# Patient Record
Sex: Female | Born: 1960 | Race: Black or African American | Hispanic: No | Marital: Single | Smoking: Current every day smoker
Health system: Southern US, Community
[De-identification: ages and names within clinical notes are randomized; demographics above are authoritative.]

## PROBLEM LIST (undated history)

## (undated) DIAGNOSIS — R519 Headache, unspecified: Secondary | ICD-10-CM

## (undated) DIAGNOSIS — M1990 Unspecified osteoarthritis, unspecified site: Secondary | ICD-10-CM

## (undated) DIAGNOSIS — I10 Essential (primary) hypertension: Secondary | ICD-10-CM

---

## 2007-03-03 HISTORY — PX: BREAST CYST ASPIRATION: SHX578

## 2007-10-01 ENCOUNTER — Ambulatory Visit: Payer: Self-pay | Admitting: Oncology

## 2007-10-03 ENCOUNTER — Emergency Department: Payer: Self-pay | Admitting: Emergency Medicine

## 2007-10-04 ENCOUNTER — Ambulatory Visit: Payer: Self-pay | Admitting: Oncology

## 2007-10-05 ENCOUNTER — Ambulatory Visit: Payer: Self-pay

## 2007-10-11 ENCOUNTER — Emergency Department: Payer: Self-pay | Admitting: Emergency Medicine

## 2007-11-01 ENCOUNTER — Ambulatory Visit: Payer: Self-pay | Admitting: Oncology

## 2013-12-01 ENCOUNTER — Emergency Department: Payer: Self-pay | Admitting: Student

## 2013-12-01 LAB — CBC
HCT: 41.9 % (ref 35.0–47.0)
HGB: 13.2 g/dL (ref 12.0–16.0)
MCH: 29 pg (ref 26.0–34.0)
MCHC: 31.6 g/dL — ABNORMAL LOW (ref 32.0–36.0)
MCV: 92 fL (ref 80–100)
Platelet: 315 10*3/uL (ref 150–440)
RBC: 4.56 10*6/uL (ref 3.80–5.20)
RDW: 14 % (ref 11.5–14.5)
WBC: 5.7 10*3/uL (ref 3.6–11.0)

## 2013-12-01 LAB — BASIC METABOLIC PANEL
Anion Gap: 6 — ABNORMAL LOW (ref 7–16)
BUN: 12 mg/dL (ref 7–18)
CALCIUM: 8.6 mg/dL (ref 8.5–10.1)
CHLORIDE: 108 mmol/L — AB (ref 98–107)
CO2: 26 mmol/L (ref 21–32)
CREATININE: 0.94 mg/dL (ref 0.60–1.30)
EGFR (Non-African Amer.): >60
Glucose: 87 mg/dL (ref 65–99)
Osmolality: 279 (ref 275–301)
Potassium: 3.5 mmol/L (ref 3.5–5.1)
Sodium: 140 mmol/L (ref 136–145)

## 2013-12-01 LAB — TROPONIN I

## 2016-02-17 ENCOUNTER — Encounter: Payer: Self-pay | Admitting: Emergency Medicine

## 2016-02-17 ENCOUNTER — Emergency Department
Admission: EM | Admit: 2016-02-17 | Discharge: 2016-02-17 | Disposition: A | Discharge status: Home / Self Care | Payer: Self-pay | Attending: Emergency Medicine | Admitting: Emergency Medicine

## 2016-02-17 ENCOUNTER — Emergency Department: Payer: Self-pay

## 2016-02-17 DIAGNOSIS — M25511 Pain in right shoulder: Secondary | ICD-10-CM | POA: Insufficient documentation

## 2016-02-17 MED ORDER — PREDNISONE 10 MG PO TABS: 40.0000 mg | ORAL_TABLET | Freq: Every day | ORAL | 0 refills | Status: AC

## 2016-02-17 MED ORDER — MELOXICAM 15 MG PO TABS: 15.0000 mg | ORAL_TABLET | Freq: Every day | ORAL | 0 refills | Status: AC

## 2016-02-17 NOTE — ED Triage Notes (Signed)
States she fell about 3 weeks ago  Landed on right shoulder  cpnts to have pain   No deformity noted

## 2016-02-17 NOTE — ED Provider Notes (Signed)
[facility] Emergency Department Provider Note  ____________________________________________  Time seen: Approximately 1:09 PM  I have reviewed the triage vital signs and the nursing notes.   HISTORY  Chief Complaint Shoulder Pain    HPI Teresa Combs is a 55 y.o. female presents to the emergency department with right shoulder pain for 3 weeks. Patient states that she was helping a friend get up and irritated something in her shoulder. Patient states that it is difficult to move arm above head. Patient has a history of tendinitis in that shoulder. Patient states that she has taken everything over-the-counter for pain.No sensation changes in fingertips.   History reviewed. No pertinent past medical history.  There are no active problems to display for this patient.   History reviewed. No pertinent surgical history.  Prior to Admission medications   Medication Sig Start Date End Date Taking? Authorizing Provider  meloxicam (MOBIC) 15 MG tablet Take 1 tablet (15 mg total) by mouth daily. 02/17/16 02/27/16  Laban Emperor, PA-C  predniSONE (DELTASONE) 10 MG tablet Take 4 tablets (40 mg total) by mouth daily. 02/17/16 02/22/16  Laban Emperor, PA-C    Allergies Patient has no known allergies.  No family history on file.  Social History Social History  Substance Use Topics  . Smoking status: Never Smoker  . Smokeless tobacco: Never Used  . Alcohol use No     Review of Systems  Constitutional: No fever/chills ENT: No upper respiratory complaints. Cardiovascular: No chest pain. Respiratory: No cough. No SOB. Gastrointestinal: No abdominal pain.  No nausea, no vomiting.  Skin: Negative for rash, abrasions, lacerations, ecchymosis. Neurological: Negative for headaches, numbness or tingling   ____________________________________________   PHYSICAL EXAM:  VITAL SIGNS: ED Triage Vitals [02/17/16 1125]  Enc Vitals Group     BP (!) 132/91     Pulse Rate 99     Resp 16     Temp 98.1 F (36.7 C)     Temp Source Oral     SpO2 99 %     Weight 128 lb (58.1 kg)     Height 5\' 5"  (1.651 m)     Head Circumference      Peak Flow      Pain Score 9     Pain Loc      Pain Edu?      Excl. in [place]?      Constitutional: Alert and oriented. Well appearing and in no acute distress. Eyes: Conjunctivae are normal. PERRL. EOMI. Head: Atraumatic. ENT:      Ears:      Nose: No congestion/rhinnorhea.      Mouth/Throat: Mucous membranes are moist.  Neck: No stridor.  No cervical spine tenderness to palpation. Hematological/Lymphatic/Immunilogical: No cervical lymphadenopathy. Cardiovascular: Normal rate, regular rhythm. Normal S1 and S2.  Good peripheral circulation. 2+ radial pulses.  Respiratory: Normal respiratory effort without tachypnea or retractions. Lungs CTAB. Good air entry to the bases with no decreased or absent breath sounds. Musculoskeletal: No gross deformities appreciated. Limited shoulder exam secondary to to pain. Patient unable to flex, extend, or abduct shoulder. Tenderness to palpation over biceps head.  Neurologic:  Normal speech and language. No gross focal neurologic deficits are appreciated. Sensation of fingers intact.  Skin:  Skin is warm, dry and intact. No rash noted. Psychiatric: Mood and affect are normal. Speech and behavior are normal. Patient exhibits appropriate insight and judgement.   ____________________________________________   LABS (all labs ordered are listed, but only abnormal results  are displayed)  Labs Reviewed - No data to display ____________________________________________  EKG   ____________________________________________  RADIOLOGY Robinette Haines, personally viewed and evaluated these images (plain radiographs) as part of my medical decision making, as well as reviewing the written report by the radiologist.  Dg Shoulder Right  Result Date: 02/17/2016 CLINICAL DATA:  The  patient fell from a standing position striking the right shoulder 3 weeks ago. Persistent anterior shoulder pain. EXAM: RIGHT SHOULDER - 2+ VIEW COMPARISON:  None in PACs FINDINGS: The bones are subjectively adequately mineralized. Small osteophytes arise from the articular margins of the humeral head. The glenohumeral joint space is preserved. There is mild narrowing of the AC joint. The subacromial subdeltoid space is preserved. The observed portions of the right clavicle and upper right ribs are normal. IMPRESSION: Mild degenerative change of the right shoulder. No acute or healing fracture is observed. If the patient's symptoms persist, MRI would be a useful next imaging step. Electronically Signed   By: David  Martinique M.D.   On: 02/17/2016 13:05    ____________________________________________    PROCEDURES  Procedure(s) performed:    Procedures    Medications - No data to display   ____________________________________________   INITIAL IMPRESSION / ASSESSMENT AND PLAN / ED COURSE  Pertinent labs & imaging results that were available during my care of the patient were reviewed by me and considered in my medical decision making (see chart for details).  Review of the [place] CSRS was performed in accordance of the [place] prior to dispensing any controlled drugs.  Clinical Course     Patient likely has a flare up of tendinitis. Patient could also have a rotator cuff tear. Patient will be discharged home with prescriptions for prednisone and meloxicam. Patient is to follow up with ortho as directed. Patient is given ED precautions to return to the ED for any worsening or new symptoms.  ____________________________________________  FINAL CLINICAL IMPRESSION(S) / ED DIAGNOSES  Final diagnoses:  Acute pain of right shoulder      NEW MEDICATIONS STARTED DURING THIS VISIT:  Discharge Medication List as of 02/17/2016  1:11 PM    START taking these medications   Details  meloxicam  (MOBIC) 15 MG tablet Take 1 tablet (15 mg total) by mouth daily., Starting Mon 02/17/2016, Until Thu 02/27/2016, Print    predniSONE (DELTASONE) 10 MG tablet Take 4 tablets (40 mg total) by mouth daily., Starting Mon 02/17/2016, Until Sat 02/22/2016, Print            This chart was dictated using voice recognition software/Dragon. Despite best efforts to proofread, errors can occur which can change the meaning. Any change was purely unintentional.    Laban Emperor, PA-C 02/17/16 1330    Harvest Dark, MD 02/17/16 [id number]

## 2016-08-12 ENCOUNTER — Ambulatory Visit: Payer: Self-pay

## 2016-09-09 ENCOUNTER — Ambulatory Visit: Payer: Self-pay | Attending: Oncology | Admitting: *Deleted

## 2016-09-09 ENCOUNTER — Encounter: Payer: Self-pay | Admitting: *Deleted

## 2016-09-09 ENCOUNTER — Encounter (INDEPENDENT_AMBULATORY_CARE_PROVIDER_SITE_OTHER): Payer: Self-pay

## 2016-09-09 ENCOUNTER — Other Ambulatory Visit: Payer: Self-pay | Admitting: *Deleted

## 2016-09-09 ENCOUNTER — Telehealth: Payer: Self-pay | Admitting: *Deleted

## 2016-09-09 VITALS — BP 131/91 | HR 65 | Temp 95.0°F | Resp 18 | Ht 67.0 in | Wt 124.0 lb

## 2016-09-09 DIAGNOSIS — N852 Hypertrophy of uterus: Secondary | ICD-10-CM

## 2016-09-09 DIAGNOSIS — N63 Unspecified lump in unspecified breast: Secondary | ICD-10-CM

## 2016-09-09 DIAGNOSIS — Z0000 Encounter for general adult medical examination without abnormal findings: Secondary | ICD-10-CM

## 2016-09-09 NOTE — Patient Instructions (Signed)
Gave patient hand-out, Women Staying Healthy, Active and Well from [facility], with education on breast health, pap smears, heart and colon health. 

## 2016-09-09 NOTE — Progress Notes (Addendum)
Subjective:     Patient ID: Teresa Combs, female   DOB: [birth year], 56 y.o.   MRN: [id number]  HPI   Review of Systems     Objective:   Physical Exam  Neck:    Pulmonary/Chest: Right breast exhibits mass, nipple discharge and tenderness. Right breast exhibits no inverted nipple and no skin change. Left breast exhibits mass and nipple discharge. Left breast exhibits no inverted nipple, no skin change and no tenderness. Breasts are symmetrical.    Bilateral breast with white nipple discharge from multiple ducts  Abdominal: There is no splenomegaly or hepatomegaly.  Genitourinary: There is no rash, tenderness, lesion or injury on the right labia. There is no rash, tenderness, lesion or injury on the left labia. Uterus is not enlarged. Cervix exhibits no motion tenderness, no discharge and no friability. Right adnexum displays no mass, no tenderness and no fullness. Left adnexum displays no mass, no tenderness and no fullness. No erythema, tenderness or bleeding in the vagina. No foreign body in the vagina. No signs of injury around the vagina. No vaginal discharge found.    Genitourinary Comments: Very full heavy uterus that is palpable vaginally and rectally.       Assessment:     56 year old Black female presents to [facility] with complaints of a right breast mass.  States the mass has been present for approximately 1 month.  History of bilateral breast masses in 2009.  Patient had a birads 4 mammogram and states she had a benign biopsy.  States she was seen by Teresa Combs at the time.  There are no pathology records for review at this time.  On clinical breast exam I can palpate a hard, mobile 3X2 cm tender mass at 8:30 to 9:00 right breast, and an approximate 1 cm firm mass at 1:00 left breast 3 cm from the areola.  There is also bilateral white nipple discharge from multiple ducts.  The discharge is on expression only.  Patient states "I have always had this discharge".  Taught self  breast exam.  There is a soft mobile, approximately 3 cm mass at the posterior cervical spine.  Patient states it has been present for about 1 year.  The patient has had no medical exams since 2009.  Specimen collected for pap smear. 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  Spoke with Teresa Combs, our OB/GYN Oncologist in regards to her enlarged uterus or possible mass.  He agreed to exam her.  Patient was examined by Teresa Combs.  He stated he wasn't really impressed with the findings.  He stated slightly enlarged retroverted uterus.  He does want to get a pelvic ultrasound since she currently has regular menstrual cycles at age 56, and periods only lasting 3-4 days. Patient has been screened for eligibility.  She does not have any insurance, Medicare or Medicaid.  She also meets financial eligibility.  Hand-out given on the Affordable Care Act.    Plan:     Will get bilateral diagnostic mammogram and ultrasound.  Order also placed for pelvic ultrasound. Will follow-up per [facility] protocol.

## 2016-09-10 NOTE — Telephone Encounter (Signed)
Patient notified of her appointment on 09/11/16 @ 4:30 for her pelvic ultrasound.

## 2016-09-11 ENCOUNTER — Ambulatory Visit
Admission: RE | Admit: 2016-09-11 | Discharge: 2016-09-11 | Disposition: A | Discharge status: Home / Self Care | Payer: Self-pay | Source: Ambulatory Visit | Attending: Obstetrics and Gynecology | Admitting: Obstetrics and Gynecology

## 2016-09-11 DIAGNOSIS — N852 Hypertrophy of uterus: Secondary | ICD-10-CM

## 2016-09-11 DIAGNOSIS — D259 Leiomyoma of uterus, unspecified: Secondary | ICD-10-CM | POA: Insufficient documentation

## 2016-09-11 LAB — PAP LB AND HPV HIGH-RISK
HPV, HIGH-RISK: POSITIVE — AB
PAP Smear Comment: 0

## 2016-09-14 ENCOUNTER — Ambulatory Visit
Admission: RE | Admit: 2016-09-14 | Discharge: 2016-09-14 | Disposition: A | Discharge status: Home / Self Care | Payer: Self-pay | Source: Ambulatory Visit | Attending: Oncology | Admitting: Oncology

## 2016-09-14 ENCOUNTER — Ambulatory Visit: Payer: Self-pay

## 2016-09-14 DIAGNOSIS — N63 Unspecified lump in unspecified breast: Secondary | ICD-10-CM

## 2016-09-15 ENCOUNTER — Other Ambulatory Visit: Payer: Self-pay | Admitting: Oncology

## 2016-09-15 ENCOUNTER — Other Ambulatory Visit: Payer: Self-pay

## 2016-09-15 DIAGNOSIS — N6001 Solitary cyst of right breast: Secondary | ICD-10-CM

## 2016-09-15 DIAGNOSIS — N63 Unspecified lump in unspecified breast: Secondary | ICD-10-CM

## 2016-09-24 ENCOUNTER — Ambulatory Visit
Admission: RE | Admit: 2016-09-24 | Discharge: 2016-09-24 | Disposition: A | Discharge status: Home / Self Care | Payer: Self-pay | Source: Ambulatory Visit | Attending: Oncology | Admitting: Oncology

## 2016-09-24 ENCOUNTER — Encounter: Payer: Self-pay | Admitting: *Deleted

## 2016-09-24 ENCOUNTER — Other Ambulatory Visit: Payer: Self-pay | Admitting: *Deleted

## 2016-09-24 DIAGNOSIS — N63 Unspecified lump in unspecified breast: Secondary | ICD-10-CM

## 2016-09-24 DIAGNOSIS — N6001 Solitary cyst of right breast: Secondary | ICD-10-CM

## 2016-09-24 NOTE — Progress Notes (Signed)
Received results of patients cyst aspiration today and need for 6 month follow-up of bilateral breast masses.  Biopsy of the left breast was cancelled today, and a 6 month follow up ultrasound was recommended.  Spoke to patient and she is agreeable to the plan.  I have also given her the results of her pelvic ultrasound demonstrating multiple fibroids.  Appointment has been scheduled for bilateral ultrasounds on March 29, 2017 @ 9:30.  Letter mailed to patient with appointment information.  Will continue to follow-up per [facility] protocol.

## 2017-03-24 ENCOUNTER — Ambulatory Visit: Payer: Self-pay | Attending: Oncology

## 2017-03-29 ENCOUNTER — Other Ambulatory Visit: Payer: Self-pay

## 2017-04-05 ENCOUNTER — Telehealth: Payer: Self-pay | Admitting: *Deleted

## 2017-04-05 NOTE — Telephone Encounter (Signed)
Patient missed her 6 month follow up mammogram.  Left her a message to return my call.

## 2017-04-23 ENCOUNTER — Telehealth: Payer: Self-pay | Admitting: *Deleted

## 2017-04-23 NOTE — Telephone Encounter (Addendum)
Patient missed her 6 month follow-up mammogram.  This the second message I have left her.  The breast center has also mailed her 2 letters.  If no response I will close case as refusal to follow-up.

## 2018-03-29 IMAGING — MG US ASPIRATION RIGHT BREAST
1 series · 2 of 2 positions shown · non-contrast
Comparison: Previous exams.

CLINICAL DATA: 56-year-old female presenting for ultrasound-guided
aspiration of a right breast cyst for symptomatic relief.

EXAM:
ULTRASOUND GUIDED RIGHT BREAST CYST ASPIRATION

[Series 1: MG view · 0.07mm/px · 2 of 2 slices shown]
[im 1/2]
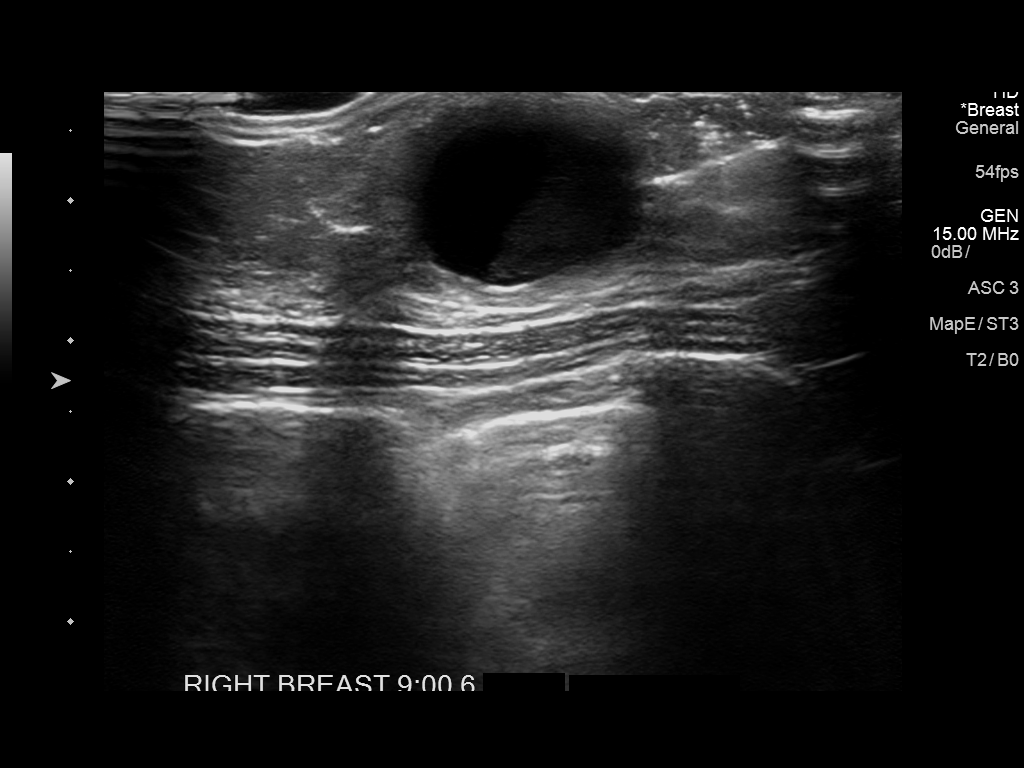
[im 2/2]
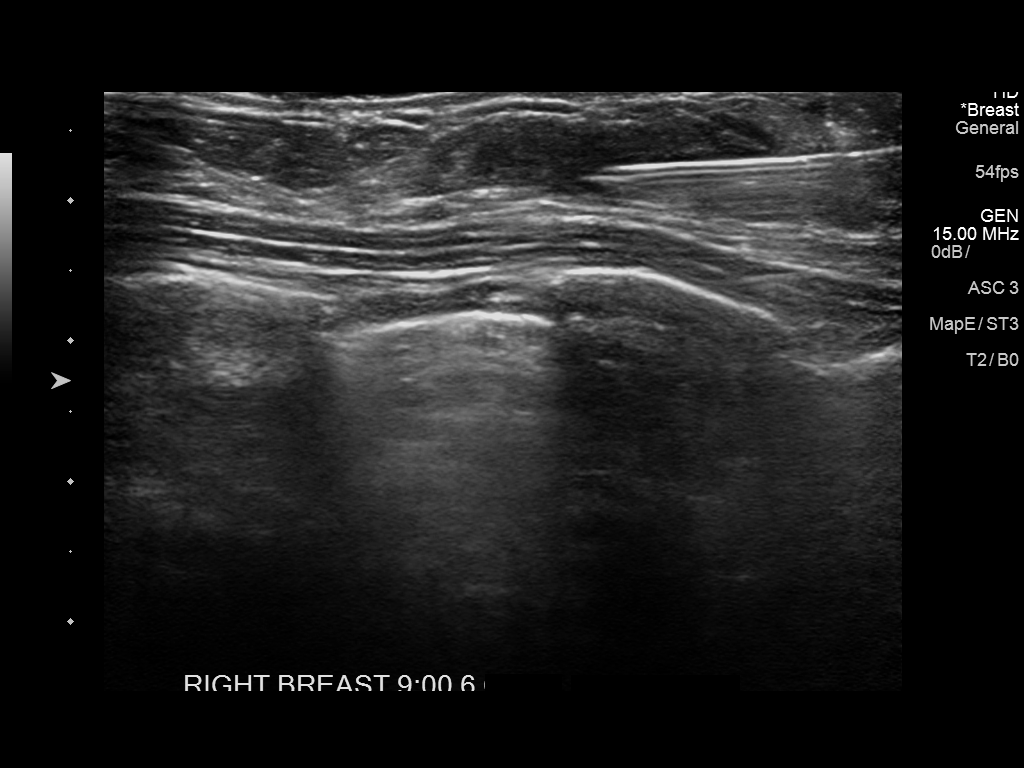

[2 of 2 positions shown; findings below may reference images not displayed]

PROCEDURE:
Using sterile technique, 1% lidocaine, under direct ultrasound
visualization, needle aspiration of a cyst in the right breast at 9
o'clock was performed. The cyst aspirated to resolution yielding
approximately 4 mL of straw-colored fluid. The wall of the cyst
appeared mildly inflamed, and therefore a follow-up ultrasound will
be performed to ensure resolution of this finding following
aspiration.
IMPRESSION: 1. Ultrasound-guided aspiration of the cyst in the right breast at 9
o'clock. No apparent complications.

2. The initially planned ultrasound-guided biopsy of the left breast
mass at [DATE] has been canceled in lieu of follow-up, as the patient
will need to return for follow-up of the right breast as well.

RECOMMENDATIONS:
Six-month follow-up bilateral ultrasound is recommended to ensure
resolution of the inflamed wall of the right breast cyst at 9
o'clock and ensure stability of the probable cluster of cysts in the
left breast at [DATE].

## 2018-12-27 IMAGING — US US BREAST*R* LIMITED INC AXILLA
1 series · 12 of 17 positions shown · non-contrast
Comparison: Previous exam(s).

CLINICAL DATA: 56-year-old female with tender lump felt in her
right breast for 1 month. The patient's referring clinician reports
a left breast lump at 1 o'clock, 3 cm from the areola.

EXAM:
2D DIGITAL DIAGNOSTIC BILATERAL MAMMOGRAM WITH CAD AND ADJUNCT TOMO
ULTRASOUND BILATERAL BREAST

[Series 1: us breast*right* limited inc axilla · 0.06mm/px · 12 of 17 slices shown]
[im 1/17]
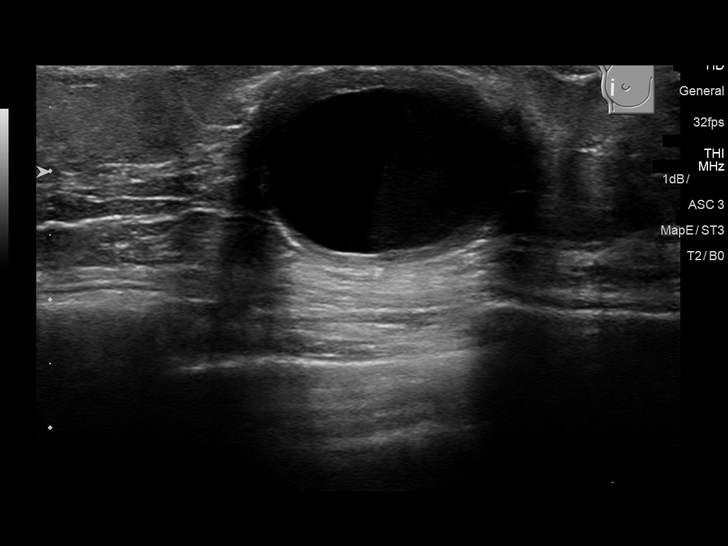
[im 3/17]
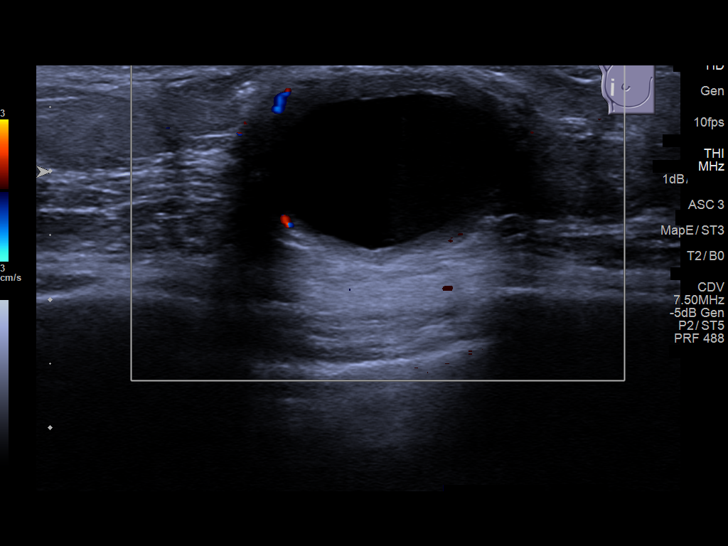
[im 4/17]
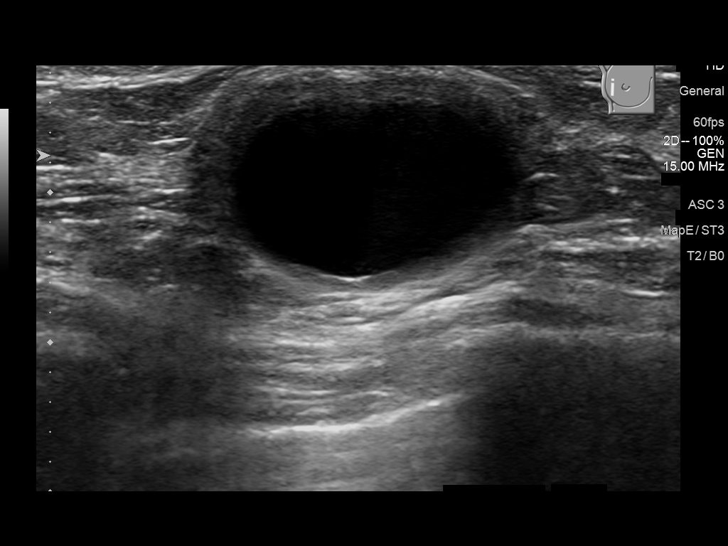
[im 5/17]
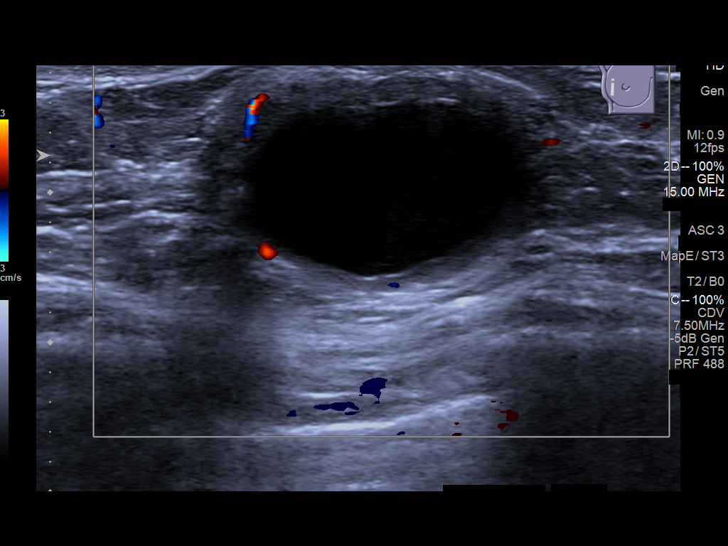
[im 7/17]
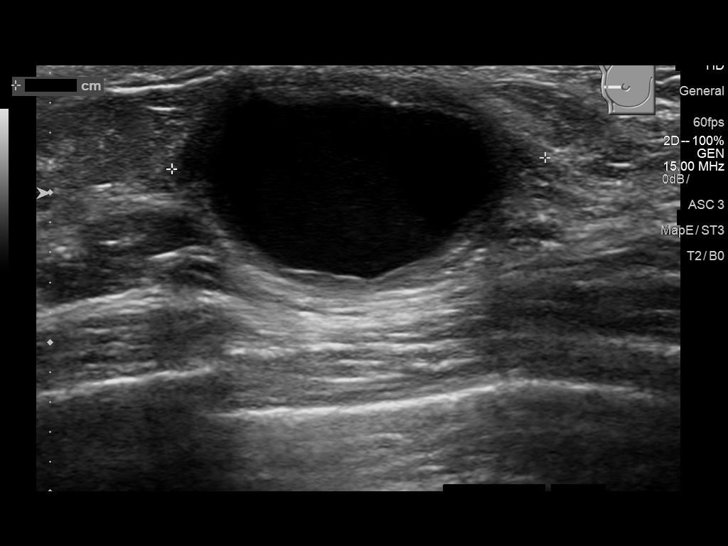
[im 8/17]
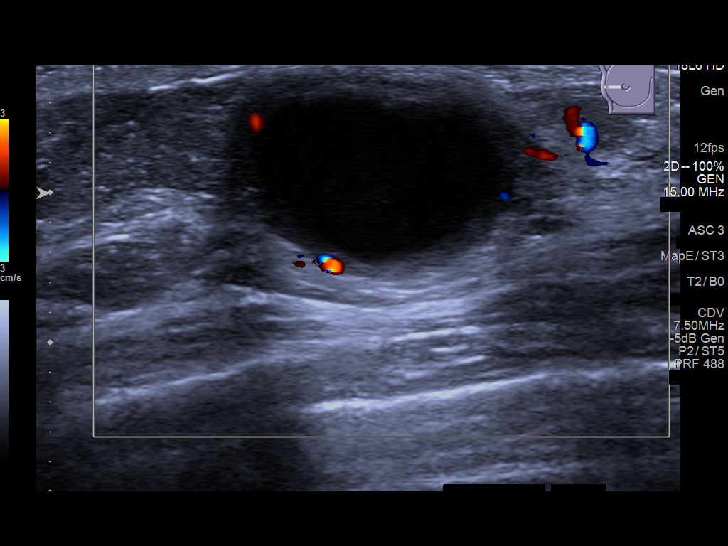
[im 10/17]
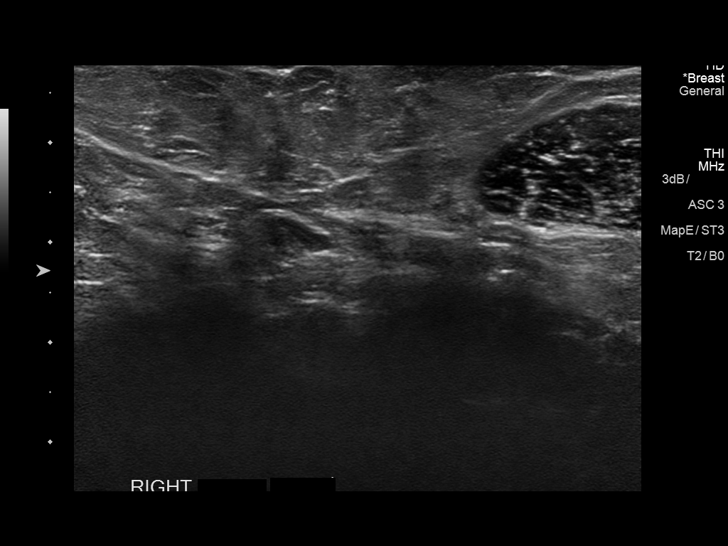
[im 11/17]
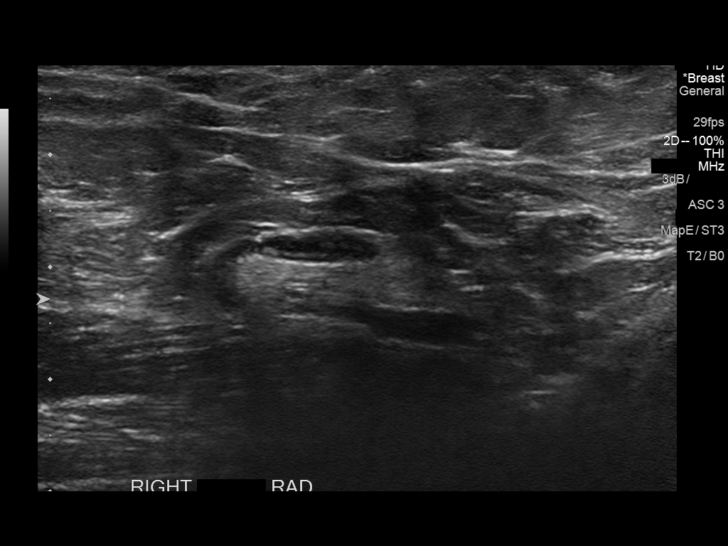
[im 13/17]
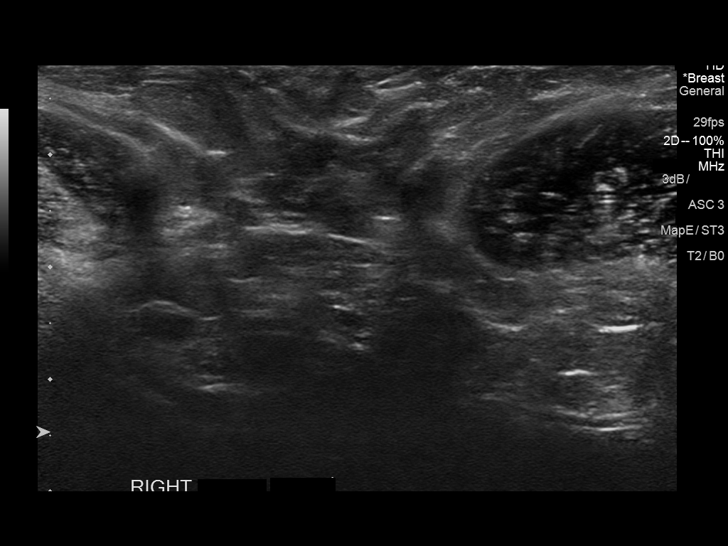
[im 14/17]
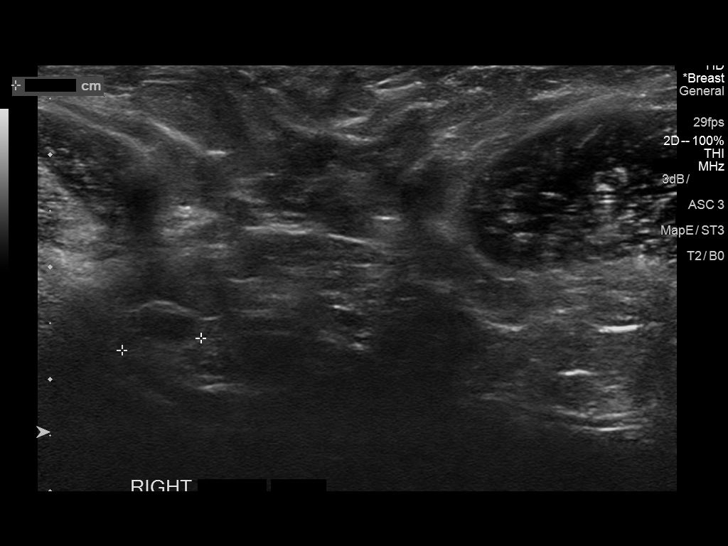
[im 15/17]
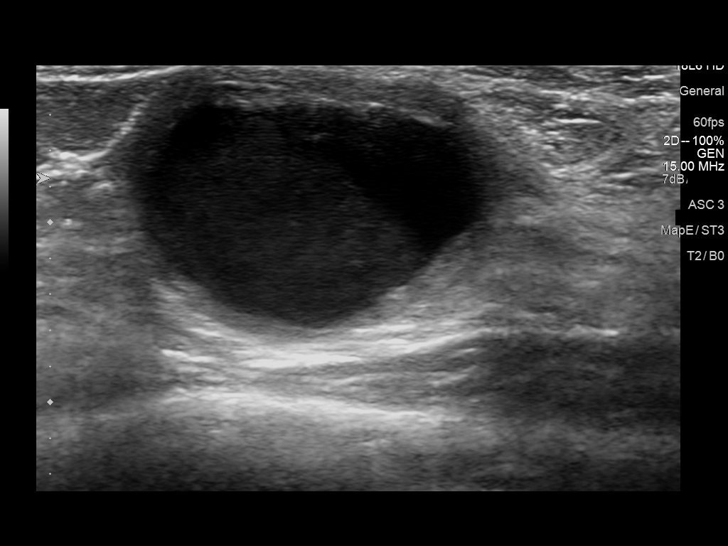
[im 17/17]
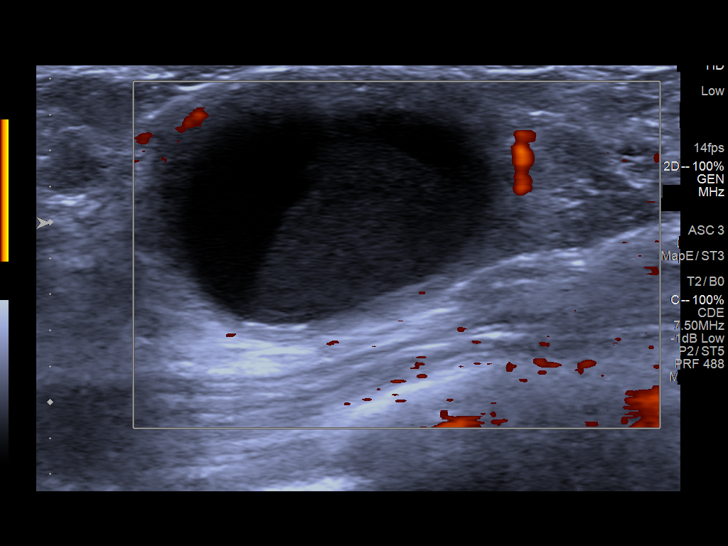

[12 of 17 positions shown; findings below may reference images not displayed]

ACR Breast Density Category c: The breast tissue is heterogeneously
dense, which may obscure small masses.
FINDINGS: An approximately 2 cm oval, circumscribed mass is noted within the
lateral right breast subjacent to the palpable marker. Several
adjacent oval, circumscribed masses are seen within the upper, outer
left breast, posterior depth, together spanning approximately
cm. Layering milk of calcium is seen within these masses. Multiple
additional smaller oval, circumscribed masses are seen throughout
both breasts. An asymmetry is seen within the posterior, superior
left breast which persists on spot compression imaging of this area
and is thought to be within the posterior, lateral left breast on
the XCCL view.

Mammographic images were processed with CAD.

On physical exam, an approximately 2 cm mobile mass is felt within
the right breast at 9 o'clock, 6 cm from the nipple, corresponding
to the patient's area of concern. An approximately 3 cm mass is felt
within the left breast at 2 o'clock, 6 cm from the nipple. No
additional mass is felt in the area of concern in the upper, outer
left breast.

Targeted ultrasound of the right breast was performed demonstrating
an oval, circumscribed, near anechoic mass at 9 o'clock, 6 cm from
the nipple measuring 2.3 x 1.6 x 2.5 cm, corresponding to the
patient's area of concern and the mass seen mammographically. Debris
is noted within the cyst, adherent to the cyst wall. No internal
vascularity was identified.

Targeted ultrasound of the left breast demonstrates a cluster of
cysts at 2 o'clock, 6 cm from the nipple measuring 3.3 x 1.2 x
cm. At at [DATE], 10 cm from the nipple, there is a smaller probable
cluster of cysts measuring 1.3 x 0.3 x 0.7 cm which is thought to
correspond to the asymmetry seen mammographically. Targeted
ultrasound of the left axilla demonstrates no suspicious appearing
axillary lymph nodes.
IMPRESSION: 1. Right breast complicated cyst corresponding to the patient's
palpable abnormality.
2. Probable cluster of cysts within the left breast at [DATE], 10 cm
from the nipple, likely corresponding to the asymmetry seen
mammographically.

RECOMMENDATION:
1. Ultrasound-guided aspiration of the right breast cyst at 9
o'clock is recommended for symptomatic relief.
2. Options including short-term follow-up versus ultrasound-guided
biopsy of the left breast finding at [DATE], 10 cm from the nipple was
discussed with the patient. The patient wishes to pursue biopsy at
this time. A left breast ultrasound-guided biopsy is recommended
with attention to the post biopsy mammogram to ensure correlation
with the asymmetry seen mammographically.

I have discussed the findings and recommendations with the patient.
Results were also provided in writing at the conclusion of the
visit. If applicable, a reminder letter will be sent to the patient
regarding the next appointment.

BI-RADS CATEGORY  3: Probably benign.

## 2019-05-18 ENCOUNTER — Ambulatory Visit: Payer: Self-pay | Attending: Internal Medicine

## 2019-05-18 DIAGNOSIS — Z23 Encounter for immunization: Secondary | ICD-10-CM

## 2019-05-18 NOTE — Progress Notes (Signed)
   Covid-19 Vaccination Clinic  Name:  CLINTONIA CARTAYA    MRN: [id number] DOB: May 01, 1960  05/18/2019  Ms. Jane was observed post Covid-19 immunization for 15 minutes without incident. She was provided with Vaccine Information Sheet and instruction to access the V-Safe system.   Ms. Valente was instructed to call 911 with any severe reactions post vaccine: Marland Kitchen Difficulty breathing  . Swelling of face and throat  . A fast heartbeat  . A bad rash all over body  . Dizziness and weakness   Immunizations Administered    Name Date Dose VIS Date Route   Pfizer COVID-19 Vaccine 05/18/2019 11:24 AM 0.3 mL 02/10/2019 Intramuscular   Manufacturer: [place]   Lot: [id number]   [place]: [id number]

## 2019-06-14 ENCOUNTER — Ambulatory Visit: Payer: Self-pay | Attending: Internal Medicine

## 2019-06-14 DIAGNOSIS — Z23 Encounter for immunization: Secondary | ICD-10-CM

## 2019-06-14 NOTE — Progress Notes (Signed)
   Covid-19 Vaccination Clinic  Name:  ENVI BIBEY    MRN: [id number] DOB: [birth year]  06/14/2019  Ms. Ruscitti was observed post Covid-19 immunization for 15 minutes without incident. She was provided with Vaccine Information Sheet and instruction to access the V-Safe system.   Ms. Schram was instructed to call 911 with any severe reactions post vaccine: Marland Kitchen Difficulty breathing  . Swelling of face and throat  . A fast heartbeat  . A bad rash all over body  . Dizziness and weakness   Immunizations Administered    Name Date Dose VIS Date Route   Pfizer COVID-19 Vaccine 06/14/2019 10:43 AM 0.3 mL 02/10/2019 Intramuscular   Manufacturer: [place]   Lot: [id number]   [place]: [id number]

## 2021-02-19 ENCOUNTER — Emergency Department: Payer: Medicaid Other

## 2021-02-19 ENCOUNTER — Emergency Department
Admission: EM | Admit: 2021-02-19 | Discharge: 2021-02-19 | Disposition: A | Discharge status: Home / Self Care | Payer: Medicaid Other | Attending: Student in an Organized Health Care Education/Training Program | Admitting: Student in an Organized Health Care Education/Training Program

## 2021-02-19 ENCOUNTER — Other Ambulatory Visit: Payer: Self-pay

## 2021-02-19 ENCOUNTER — Encounter: Payer: Self-pay | Admitting: Emergency Medicine

## 2021-02-19 DIAGNOSIS — R221 Localized swelling, mass and lump, neck: Secondary | ICD-10-CM | POA: Diagnosis not present

## 2021-02-19 DIAGNOSIS — Z79899 Other long term (current) drug therapy: Secondary | ICD-10-CM | POA: Diagnosis not present

## 2021-02-19 DIAGNOSIS — Z87891 Personal history of nicotine dependence: Secondary | ICD-10-CM | POA: Diagnosis not present

## 2021-02-19 DIAGNOSIS — I10 Essential (primary) hypertension: Secondary | ICD-10-CM | POA: Diagnosis not present

## 2021-02-19 HISTORY — DX: Essential (primary) hypertension: I10

## 2021-02-19 LAB — COMPREHENSIVE METABOLIC PANEL
ALT: 20 U/L (ref 0–44)
AST: 24 U/L (ref 15–41)
Albumin: 3.9 g/dL (ref 3.5–5.0)
Alkaline Phosphatase: 85 U/L (ref 38–126)
Anion gap: 7 (ref 5–15)
BUN: 19 mg/dL (ref 6–20)
CO2: 28 mmol/L (ref 22–32)
Calcium: 9.3 mg/dL (ref 8.9–10.3)
Chloride: 106 mmol/L (ref 98–111)
Creatinine, Ser: 0.79 mg/dL (ref 0.44–1.00)
GFR, Estimated: >60 mL/min (ref >60)
Glucose, Bld: 72 mg/dL (ref 70–99)
Potassium: 3.6 mmol/L (ref 3.5–5.1)
Sodium: 141 mmol/L (ref 135–145)
Total Bilirubin: 0.7 mg/dL (ref 0.3–1.2)
Total Protein: 7.9 g/dL (ref 6.5–8.1)

## 2021-02-19 LAB — CBC WITH DIFFERENTIAL/PLATELET
Abs Immature Granulocytes: 0.01 10*3/uL (ref 0.00–0.07)
Basophils Absolute: 0.1 10*3/uL (ref 0.0–0.1)
Basophils Relative: 1 %
Eosinophils Absolute: 0.4 10*3/uL (ref 0.0–0.5)
Eosinophils Relative: 6 %
HCT: 43.2 % (ref 36.0–46.0)
Hemoglobin: 14.1 g/dL (ref 12.0–15.0)
Immature Granulocytes: 0 %
Lymphocytes Relative: 33 %
Lymphs Abs: 1.9 10*3/uL (ref 0.7–4.0)
MCH: 30.4 pg (ref 26.0–34.0)
MCHC: 32.6 g/dL (ref 30.0–36.0)
MCV: 93.1 fL (ref 80.0–100.0)
Monocytes Absolute: 0.8 10*3/uL (ref 0.1–1.0)
Monocytes Relative: 14 %
Neutro Abs: 2.7 10*3/uL (ref 1.7–7.7)
Neutrophils Relative %: 46 %
Platelets: 327 10*3/uL (ref 150–400)
RBC: 4.64 MIL/uL (ref 3.87–5.11)
RDW: 13.6 % (ref 11.5–15.5)
WBC: 5.9 10*3/uL (ref 4.0–10.5)
nRBC: 0 % (ref 0.0–0.2)

## 2021-02-19 MED ORDER — AMLODIPINE BESYLATE 5 MG PO TABS
5.0000 mg | ORAL_TABLET | Freq: Once | ORAL | Status: AC
  Administered 2021-02-19: 12:00:00 5 mg via ORAL
  Filled 2021-02-19: qty 1

## 2021-02-19 MED ORDER — AMLODIPINE BESYLATE 5 MG PO TABS: 5.0000 mg | ORAL_TABLET | Freq: Every day | ORAL | 1 refills | Status: DC

## 2021-02-19 NOTE — ED Triage Notes (Addendum)
Pt to ED via POV, pt has large lump on the back of her neck and she states that it is causing her pain. Pt is in NAD.  Pt also reports that for the last 2 months she has noticed decreased sensation in the left lower leg. Pt also states that she has had recent weight loss and loss of appetite.

## 2021-02-19 NOTE — ED Provider Notes (Signed)
[facility] Emergency Department Provider Note    Event Date/Time   First MD Initiated Contact with Patient 02/19/21 1121     (approximate)  I have reviewed the triage vital signs and the nursing notes.   HISTORY  Chief Complaint Abscess    HPI Teresa Combs is a 61 y.o. female history of hypertension not on any antihypertensive medications presents to the ER due to concern for elevated blood pressure she has been trying to donate plasma but has been told that she cannot due to elevated blood pressure so she was sent to the ER.  Also wants to have a lump on the right side of her neck evaluated.  States been present for several months but is swelling and she thinks it is causing pain in her ear and headache.  Denies any fevers no drainage.  Denies any trauma.    Past Medical History:  Diagnosis Date   Hypertension    Family History  Problem Relation Age of Onset   Breast cancer Neg Hx    Past Surgical History:  Procedure Laterality Date   BREAST CYST ASPIRATION Bilateral 2009   neg   There are no problems to display for this patient.     Prior to Admission medications   Medication Sig Start Date End Date Taking? Authorizing Provider  amLODipine (NORVASC) 5 MG tablet Take 1 tablet (5 mg total) by mouth daily. 02/19/21 02/19/22 Yes Merlyn Lot, MD    Allergies Patient has no known allergies.    Social History Social History   Tobacco Use   Smoking status: Former    Types: Cigarettes   Smokeless tobacco: Never  Substance Use Topics   Alcohol use: Yes   Drug use: Not Currently    Review of Systems Patient denies headaches, rhinorrhea, blurry vision, numbness, shortness of breath, chest pain, edema, cough, abdominal pain, nausea, vomiting, diarrhea, dysuria, fevers, rashes or hallucinations unless otherwise stated above in HPI. ____________________________________________   PHYSICAL EXAM:  VITAL SIGNS: Vitals:    02/19/21 1200 02/19/21 1349  BP: (!) 160/107 (!) 170/116  Pulse: 65   Resp: 16   Temp:    SpO2: 99%     Constitutional: Alert and oriented.  Eyes: Conjunctivae are normal.  Head: Atraumatic. Nose: No congestion/rhinnorhea. Mouth/Throat: Mucous membranes are moist.   Neck: No stridor. Painless ROM.  Is a nontender soft mobile area of swelling without overlying erythema in the right posterior neck.  Is nonpulsatile Cardiovascular: Normal rate, regular rhythm. Grossly normal heart sounds.  Good peripheral circulation. Respiratory: Normal respiratory effort.  No retractions. Lungs CTAB. Gastrointestinal: Soft and nontender. No distention. No abdominal bruits. No CVA tenderness. Genitourinary:  Musculoskeletal: No lower extremity tenderness nor edema.  No joint effusions. Neurologic:  Normal speech and language. No gross focal neurologic deficits are appreciated. No facial droop Skin:  Skin is warm, dry and intact. No rash noted. Psychiatric: Mood and affect are normal. Speech and behavior are normal.  ____________________________________________   LABS (all labs ordered are listed, but only abnormal results are displayed)  Results for orders placed or performed during the hospital encounter of 02/19/21 (from the past 24 hour(s))  CBC with Differential     Status: None   Collection Time: 02/19/21  9:55 AM  Result Value Ref Range   WBC 5.9 4.0 - 10.5 K/uL   RBC 4.64 3.87 - 5.11 MIL/uL   Hemoglobin 14.1 12.0 - 15.0 g/dL   HCT 43.2 36.0 - 46.0 %  MCV 93.1 80.0 - 100.0 fL   MCH 30.4 26.0 - 34.0 pg   MCHC 32.6 30.0 - 36.0 g/dL   RDW 13.6 11.5 - 15.5 %   Platelets 327 150 - 400 K/uL   nRBC 0.0 0.0 - 0.2 %   Neutrophils Relative % 46 %   Neutro Abs 2.7 1.7 - 7.7 K/uL   Lymphocytes Relative 33 %   Lymphs Abs 1.9 0.7 - 4.0 K/uL   Monocytes Relative 14 %   Monocytes Absolute 0.8 0.1 - 1.0 K/uL   Eosinophils Relative 6 %   Eosinophils Absolute 0.4 0.0 - 0.5 K/uL   Basophils  Relative 1 %   Basophils Absolute 0.1 0.0 - 0.1 K/uL   Immature Granulocytes 0 %   Abs Immature Granulocytes 0.01 0.00 - 0.07 K/uL  Comprehensive metabolic panel     Status: None   Collection Time: 02/19/21  9:55 AM  Result Value Ref Range   Sodium 141 135 - 145 mmol/L   Potassium 3.6 3.5 - 5.1 mmol/L   Chloride 106 98 - 111 mmol/L   CO2 28 22 - 32 mmol/L   Glucose, Bld 72 70 - 99 mg/dL   BUN 19 6 - 20 mg/dL   Creatinine, Ser 0.79 0.44 - 1.00 mg/dL   Calcium 9.3 8.9 - 10.3 mg/dL   Total Protein 7.9 6.5 - 8.1 g/dL   Albumin 3.9 3.5 - 5.0 g/dL   AST 24 15 - 41 U/L   ALT 20 0 - 44 U/L   Alkaline Phosphatase 85 38 - 126 U/L   Total Bilirubin 0.7 0.3 - 1.2 mg/dL   GFR, Estimated >60 >60 mL/min   Anion gap 7 5 - 15   ____________________________________________  EKG____________________________________________  RADIOLOGY  I personally reviewed all radiographic images ordered to evaluate for the above acute complaints and reviewed radiology reports and findings.  These findings were personally discussed with the patient.  Please see medical record for radiology report.  ____________________________________________   PROCEDURES  Procedure(s) performed:  Procedures    Critical Care performed: no ____________________________________________   INITIAL IMPRESSION / ASSESSMENT AND PLAN / ED COURSE  Pertinent labs & imaging results that were available during my care of the patient were reviewed by me and considered in my medical decision making (see chart for details).   DDX: mass, lipoma, abscess, lymphoma, htn  Teresa Combs is a 60 y.o. who presents to the ED with chief complaint of several weeks of noticing some swelling on the right posterior neck as well as concerned because her blood pressure is elevated.  Denies any chest pain.  No numbness or tingling.  Exam is reassuring.  She is supposed to be on antihypertensive medication but has not been on them.  Not  consistent with CHF or ACSwith CVA.  Mass in her neck I suspect is a lipoma no overlying warmth but given its location will order ultrasound to further evaluate.  Clinical Course as of 02/19/21 1432  Wed Feb 19, 2021  1346 Ultrasound suggestive of lipoma no sign of abscess.  Patient remains well-appearing in no acute distress.  Reassuring work-up.  We will send prescription for antihypertensives to her pharmacy.  Discussed outpatient follow-up. [PR]    Clinical Course User Index [PR] Merlyn Lot, MD    The patient was evaluated in Emergency Department today for the symptoms described in the history of present illness. He/she was evaluated in the context of the global COVID-19 pandemic, which necessitated consideration that  the patient might be at risk for infection with the SARS-CoV-2 virus that causes COVID-19. Institutional protocols and algorithms that pertain to the evaluation of patients at risk for COVID-19 are in a state of rapid change based on information released by regulatory bodies including the CDC and federal and state organizations. These policies and algorithms were followed during the patient's care in the ED.  As part of my medical decision making, I reviewed the following data within the [facility] notes reviewed and incorporated, Labs reviewed, notes from prior ED visits and [place] Controlled Substance Database   ____________________________________________   FINAL CLINICAL IMPRESSION(S) / ED DIAGNOSES  Final diagnoses:  Neck mass  Hypertension, unspecified type      NEW MEDICATIONS STARTED DURING THIS VISIT:  Discharge Medication List as of 02/19/2021  1:48 PM     START taking these medications   Details  amLODipine (NORVASC) 5 MG tablet Take 1 tablet (5 mg total) by mouth daily., Starting Wed 02/19/2021, Until Thu 02/19/2022, Normal         Note:  This document was prepared using Dragon voice recognition software and may include  unintentional dictation errors.    Merlyn Lot, MD 02/19/21 [phone]

## 2021-03-07 ENCOUNTER — Ambulatory Visit (INDEPENDENT_AMBULATORY_CARE_PROVIDER_SITE_OTHER): Payer: Self-pay | Admitting: Surgery

## 2021-03-07 ENCOUNTER — Encounter: Payer: Self-pay | Admitting: Surgery

## 2021-03-07 ENCOUNTER — Other Ambulatory Visit: Payer: Self-pay

## 2021-03-07 VITALS — BP 144/97 | HR 81 | Temp 98.4°F | Ht 64.0 in | Wt 116.0 lb

## 2021-03-07 DIAGNOSIS — R221 Localized swelling, mass and lump, neck: Secondary | ICD-10-CM

## 2021-03-07 NOTE — Patient Instructions (Signed)
Our surgery scheduler will call you within 24-48 hours to schedule your surgery.Please have the [facility] surgery sheet available when speaking with her.    Lipoma Removal Lipoma removal is a surgical procedure to remove a lipoma, which is a noncancerous (benign) tumor that is made up of fat cells. Most lipomas are small and painless and do not require treatment. They can form in many areas of the body but are most common under the skin of the back, arms, shoulders, buttocks, and thighs. You may need lipoma removal if you have a lipoma that is large, growing, or causing discomfort. Lipoma removal may also be done for cosmetic reasons. Tell a health care provider about: Any allergies you have. All medicines you are taking, including vitamins, herbs, eye drops, creams, and over-the-counter medicines. Any problems you or family members have had with anesthetic medicines. Any blood disorders you have. Any surgeries you have had. Any medical conditions you have. Whether you are pregnant or may be pregnant. What are the risks? Generally, this is a safe procedure. However, problems may occur, including: Infection. Bleeding. Scarring. Allergic reactions to medicines. Damage to nearby structures or organs, such as damage to nerves or blood vessels near the lipoma. What happens before the procedure? Staying hydrated Follow instructions from your health care provider about hydration, which may include: Up to 2 hours before the procedure - you may continue to drink clear liquids, such as water, clear fruit juice, black coffee, and plain tea. Eating and drinking restrictions Follow instructions from your health care provider about eating and drinking, which may include: 8 hours before the procedure - stop eating heavy meals or foods, such as meat, fried foods, or fatty foods. 6 hours before the procedure - stop eating light meals or foods, such as toast or cereal. 6 hours before the procedure - stop  drinking milk or drinks that contain milk. 2 hours before the procedure - stop drinking clear liquids. Medicines Ask your health care provider about: Changing or stopping your regular medicines. This is especially important if you are taking diabetes medicines or blood thinners. Taking medicines such as aspirin and ibuprofen. These medicines can thin your blood. Do not take these medicines unless your health care provider tells you to take them. Taking over-the-counter medicines, vitamins, herbs, and supplements. General instructions You will have a physical exam. Your health care provider will check the size of the lipoma and whether it can be moved easily. You may have a biopsy and imaging tests, such as X-rays, a CT scan, and an MRI. Do not use any products that contain nicotine or tobacco for at least 4 weeks before the procedure. These products include cigarettes, e-cigarettes, and chewing tobacco. If you need help quitting, ask your health care provider. Ask your health care provider: How your surgery site will be marked. What steps will be taken to help prevent infection. These may include: Washing skin with a germ-killing soap. Taking antibiotic medicine. Plan to have someone take you home from the hospital or clinic. If you will be going home right after the procedure, plan to have someone with you for 24 hours. What happens during the procedure?  An IV will be inserted into one of your veins. You will be given one or more of the following: A medicine to help you relax (sedative). A medicine to numb the area (local anesthetic). A medicine to make you fall asleep (general anesthetic). A medicine that is injected into an area of your body to  numb everything below the injection site (regional anesthetic). An incision will be made over the lipoma or very near the lipoma. The incision may be made in a natural skin line or crease. Tissues, nerves, and blood vessels near the lipoma will  be moved out of the way. The lipoma and the capsule that surrounds it will be separated from the surrounding tissues. The lipoma will be removed. The incision may be closed with stitches (sutures). A bandage (dressing) will be placed over the incision. The procedure may vary among health care providers and hospitals. What happens after the procedure? Your blood pressure, heart rate, breathing rate, and blood oxygen level will be monitored until you leave the hospital or clinic. If you were prescribed an antibiotic medicine, use it as told by your health care provider. Do not stop using the antibiotic even if you start to feel better. If you were given a sedative during the procedure, it can affect you for several hours. Do not drive or operate machinery until your health care provider says that it is safe. Summary Before the procedure, follow instructions from your health care provider about eating and drinking, and changing or stopping your regular medicines. This is especially important if you are taking diabetes medicines or blood thinners. After the lipoma is removed, the incision may be closed with stitches (sutures) and covered with a bandage (dressing). If you were given a sedative during the procedure, it can affect you for several hours. Do not drive or operate machinery until your health care provider says that it is safe. This information is not intended to replace advice given to you by your health care provider. Make sure you discuss any questions you have with your health care provider. Document Revised: 10/03/2018 Document Reviewed: 10/03/2018 Elsevier Patient Education  [place].

## 2021-03-07 NOTE — Progress Notes (Signed)
03/07/2021  Reason for Visit:  Right neck mass  History of Present Illness: Teresa Combs is a 61 y.o. female presenting for evaluation of a right neck mass.  The patient presented to the ER on 02/19/21 with hypertension and headache and concerns for this right neck mass.  The patient reports that she has had it for many years and it started very small, but has been growing slowly.  It is located in the posterolateral right neck and she feels that it's contributing to headaches and to ear ringing.  Denies any difficulty with range of motion or pain at the mass itself.  Denies any overlying skin changes such as erythema or induration and denies any drainage.  Denies any other masses.  In the ER she had an ultrasound which found a soft tissue mass measuring 5.2 x 0.7 x 4.5 cm, favoring a lipoma.  Past Medical History: Past Medical History:  Diagnosis Date   Hypertension      Past Surgical History: Past Surgical History:  Procedure Laterality Date   BREAST CYST ASPIRATION Bilateral 2009   neg    Home Medications: Prior to Admission medications   Medication Sig Start Date End Date Taking? Authorizing Provider  amLODipine (NORVASC) 5 MG tablet Take 1 tablet (5 mg total) by mouth daily. 02/19/21 02/19/22 Yes Merlyn Lot, MD    Allergies: No Known Allergies  Social History:  reports that she has quit smoking. Her smoking use included cigarettes. She has a 2.50 pack-year smoking history. She has never used smokeless tobacco. She reports current alcohol use. She reports that she does not currently use drugs.   Family History: Family History  Problem Relation Age of Onset   Breast cancer Neg Hx     Review of Systems: Review of Systems  Constitutional:  Negative for chills and fever.  HENT:  Positive for tinnitus.   Respiratory:  Negative for shortness of breath.   Cardiovascular:  Negative for chest pain.  Gastrointestinal:  Negative for abdominal pain, nausea and  vomiting.  Genitourinary:  Negative for dysuria.  Musculoskeletal:  Negative for myalgias.  Skin:  Negative for rash.  Neurological:  Positive for headaches.  Psychiatric/Behavioral:  Negative for depression.    Physical Exam BP (!) 144/97    Pulse 81    Temp 98.4 F (36.9 C) (Oral)    Ht 5\' 4"  (1.626 m)    Wt 116 lb (52.6 kg)    LMP 08/20/2016    SpO2 97%    BMI 19.91 kg/m  CONSTITUTIONAL: No acute distress HEENT:  Normocephalic, atraumatic, extraocular motion intact. NECK: The patient has a 5 cm mass in the posterolateral aspect of the right neck, towards the posterior margin of the right sternocleidomastoid muscle.  The mass is soft, somewhat mobile, without tenderness.  No overlying skin erythema, induration, or drainage. RESPIRATORY:  Lungs are clear, and breath sounds are equal bilaterally. Normal respiratory effort without pathologic use of accessory muscles. CARDIOVASCULAR: Heart is regular without murmurs, gallops, or rubs. MUSCULOSKELETAL:  Normal muscle strength and tone in all four extremities.  No peripheral edema or cyanosis. SKIN: Skin turgor is normal. There are no pathologic skin lesions.  NEUROLOGIC:  Motor and sensation is grossly normal.  Cranial nerves are grossly intact. PSYCH:  Alert and oriented to person, place and time. Affect is normal.  Laboratory Analysis: Labs from 02/19/21: Sodium 141, potassium 3.6, chloride 106, CO2 28, BUN 19, creatinine 0.79.  LFTs within normal.  WBC 5.9, hemoglobin 14.1, hematocrit  43.2, platelets 327  Imaging: Ultrasound right neck on 02/19/2021: FINDINGS: In the area of clinical concern, there is an isoechoic distinctly marginated mass measuring 5.2 cm x 0.7 cm x 4.5 cm. There is no evidence of violation of the surrounding tissue planes. There is no flow within the lesion.   IMPRESSION: Isoechoic lesion in the area of clinical concern as described above is favored to reflect a lipoma; however, given size and the history of  growth and pain, recommend MRI with and without contrast for further evaluation. This may be obtained on a nonemergent basis.  Assessment and Plan: This is a 61 y.o. 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  - Discussed with the patient that given the slow growth over many years, mobility, softness of the mass, and ultrasound findings, this is more favored to be a lipoma.  Based on the location, I would see how the mass could potentially be pushing on the posterior auricular nerve or be pushing on the muscles that could cause some tension headaches.  On the ultrasound, there is no penetration of the mass through the muscle tissue. - Discussed with the patient that we could proceed with excision of this mass, but based on location and size, I would favor for this to be done in the operating room, so we can have better control of the operative field and be able to be more careful and thorough particularly based on the location.  Discussed with her the surgery at length which would be an excision of a posterior right neck lipoma including the risks of bleeding, infection, injury to surrounding structures, that this an outpatient procedure, postoperative activity restrictions, and she is willing to proceed. - We will schedule the patient for surgery on 03/25/2021.  All questions answered.  I spent 45 minutes dedicated to the care of this patient on the date of this encounter to include pre-visit review of records, face-to-face time with the patient discussing diagnosis and management, and any post-visit coordination of care.   Melvyn Neth, [facility]

## 2021-03-07 NOTE — H&P (View-Only) (Signed)
03/07/2021  Reason for Visit:  Right neck mass  History of Present Illness: Teresa Combs is a 61 y.o. female presenting for evaluation of a right neck mass.  The patient presented to the ER on 02/19/21 with hypertension and headache and concerns for this right neck mass.  The patient reports that she has had it for many years and it started very small, but has been growing slowly.  It is located in the posterolateral right neck and she feels that it's contributing to headaches and to ear ringing.  Denies any difficulty with range of motion or pain at the mass itself.  Denies any overlying skin changes such as erythema or induration and denies any drainage.  Denies any other masses.  In the ER she had an ultrasound which found a soft tissue mass measuring 5.2 x 0.7 x 4.5 cm, favoring a lipoma.  Past Medical History: Past Medical History:  Diagnosis Date   Hypertension      Past Surgical History: Past Surgical History:  Procedure Laterality Date   BREAST CYST ASPIRATION Bilateral 2009   neg    Home Medications: Prior to Admission medications   Medication Sig Start Date End Date Taking? Authorizing Provider  amLODipine (NORVASC) 5 MG tablet Take 1 tablet (5 mg total) by mouth daily. 02/19/21 02/19/22 Yes Merlyn Lot, MD    Allergies: No Known Allergies  Social History:  reports that she has quit smoking. Her smoking use included cigarettes. She has a 2.50 pack-year smoking history. She has never used smokeless tobacco. She reports current alcohol use. She reports that she does not currently use drugs.   Family History: Family History  Problem Relation Age of Onset   Breast cancer Neg Hx     Review of Systems: Review of Systems  Constitutional:  Negative for chills and fever.  HENT:  Positive for tinnitus.   Respiratory:  Negative for shortness of breath.   Cardiovascular:  Negative for chest pain.  Gastrointestinal:  Negative for abdominal pain, nausea and  vomiting.  Genitourinary:  Negative for dysuria.  Musculoskeletal:  Negative for myalgias.  Skin:  Negative for rash.  Neurological:  Positive for headaches.  Psychiatric/Behavioral:  Negative for depression.    Physical Exam BP (!) 144/97    Pulse 81    Temp 98.4 F (36.9 C) (Oral)    Ht 5\' 4"  (1.626 m)    Wt 116 lb (52.6 kg)    LMP 08/20/2016    SpO2 97%    BMI 19.91 kg/m  CONSTITUTIONAL: No acute distress HEENT:  Normocephalic, atraumatic, extraocular motion intact. NECK: The patient has a 5 cm mass in the posterolateral aspect of the right neck, towards the posterior margin of the right sternocleidomastoid muscle.  The mass is soft, somewhat mobile, without tenderness.  No overlying skin erythema, induration, or drainage. RESPIRATORY:  Lungs are clear, and breath sounds are equal bilaterally. Normal respiratory effort without pathologic use of accessory muscles. CARDIOVASCULAR: Heart is regular without murmurs, gallops, or rubs. MUSCULOSKELETAL:  Normal muscle strength and tone in all four extremities.  No peripheral edema or cyanosis. SKIN: Skin turgor is normal. There are no pathologic skin lesions.  NEUROLOGIC:  Motor and sensation is grossly normal.  Cranial nerves are grossly intact. PSYCH:  Alert and oriented to person, place and time. Affect is normal.  Laboratory Analysis: Labs from 02/19/21: Sodium 141, potassium 3.6, chloride 106, CO2 28, BUN 19, creatinine 0.79.  LFTs within normal.  WBC 5.9, hemoglobin 14.1, hematocrit  43.2, platelets 327  Imaging: Ultrasound right neck on 02/19/2021: FINDINGS: In the area of clinical concern, there is an isoechoic distinctly marginated mass measuring 5.2 cm x 0.7 cm x 4.5 cm. There is no evidence of violation of the surrounding tissue planes. There is no flow within the lesion.   IMPRESSION: Isoechoic lesion in the area of clinical concern as described above is favored to reflect a lipoma; however, given size and the history of  growth and pain, recommend MRI with and without contrast for further evaluation. This may be obtained on a nonemergent basis.  Assessment and Plan: This is a 61 y.o. 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  - Discussed with the patient that given the slow growth over many years, mobility, softness of the mass, and ultrasound findings, this is more favored to be a lipoma.  Based on the location, I would see how the mass could potentially be pushing on the posterior auricular nerve or be pushing on the muscles that could cause some tension headaches.  On the ultrasound, there is no penetration of the mass through the muscle tissue. - Discussed with the patient that we could proceed with excision of this mass, but based on location and size, I would favor for this to be done in the operating room, so we can have better control of the operative field and be able to be more careful and thorough particularly based on the location.  Discussed with her the surgery at length which would be an excision of a posterior right neck lipoma including the risks of bleeding, infection, injury to surrounding structures, that this an outpatient procedure, postoperative activity restrictions, and she is willing to proceed. - We will schedule the patient for surgery on 03/25/2021.  All questions answered.  I spent 45 minutes dedicated to the care of this patient on the date of this encounter to include pre-visit review of records, face-to-face time with the patient discussing diagnosis and management, and any post-visit coordination of care.   Melvyn Neth, [facility]

## 2021-03-10 ENCOUNTER — Telehealth: Payer: Self-pay | Admitting: Surgery

## 2021-03-10 NOTE — Telephone Encounter (Signed)
Pt has been advised of Pre-Admission date/time, COVID Testing date and Surgery date.  Surgery Date: 03/25/21 Preadmission Testing Date: 03/18/21 (phone 8a-1p) Covid Testing Date: Not needed.     Patient has been made aware to call [phone], between 1-3:00pm the day before surgery, to find out what time to arrive for surgery.

## 2021-03-18 ENCOUNTER — Other Ambulatory Visit
Admission: RE | Admit: 2021-03-18 | Discharge: 2021-03-18 | Disposition: A | Discharge status: Home / Self Care | Payer: Medicaid Other | Source: Ambulatory Visit | Attending: Surgery | Admitting: Surgery

## 2021-03-18 ENCOUNTER — Other Ambulatory Visit: Payer: Self-pay

## 2021-03-18 HISTORY — DX: Unspecified osteoarthritis, unspecified site: M19.90

## 2021-03-18 HISTORY — DX: Headache, unspecified: R51.9

## 2021-03-18 NOTE — Patient Instructions (Addendum)
Your procedure is scheduled on: 03/25/21 - Tuesday Report to the Registration Desk on the 1st floor of the [facility]. To find out your arrival time, please call [phone] between 1PM - 3PM on: 03/24/21 - Monday  REMEMBER: Instructions that are not followed completely may result in serious medical risk, up to and including death; or upon the discretion of your surgeon and anesthesiologist your surgery may need to be rescheduled.  Do not eat food after midnight the night before surgery.  No gum chewing, lozengers or hard candies.  You may however, drink CLEAR liquids up to 2 hours before you are scheduled to arrive for your surgery. Do not drink anything within 2 hours of your scheduled arrival time.  Clear liquids include: - water  - apple juice without pulp - gatorade (not RED, PURPLE, OR BLUE) - black coffee or tea (Do NOT add milk or creamers to the coffee or tea) Do NOT drink anything that is not on this list.  TAKE THESE MEDICATIONS THE MORNING OF SURGERY WITH A SIP OF WATER:  - amLODipine (NORVASC) 5 MG tablet  One week prior to surgery: Stop Anti-inflammatories (NSAIDS) such as Advil, Aleve, Ibuprofen, Motrin, Naproxen, Naprosyn and Aspirin based products such as Excedrin, Goodys Powder, BC Powder.  Stop ANY OVER THE COUNTER supplements until after surgery.  You may however, continue to take Tylenol if needed for pain up until the day of surgery.  No Alcohol for 24 hours before or after surgery.  No Smoking including e-cigarettes for 24 hours prior to surgery.  No chewable tobacco products for at least 6 hours prior to surgery.  No nicotine patches on the day of surgery.  Do not use any "recreational" drugs for at least a week prior to your surgery.  Please be advised that the combination of cocaine and anesthesia may have negative outcomes, up to and including death. If you test positive for cocaine, your surgery will be cancelled.  On the morning of surgery  brush your teeth with toothpaste and water, you may rinse your mouth with mouthwash if you wish. Do not swallow any toothpaste or mouthwash.  Use CHG Soap or wipes as directed on instruction sheet.  Do not wear jewelry, make-up, hairpins, clips or nail polish.  Do not wear lotions, powders, or perfumes.   Do not shave body from the neck down 48 hours prior to surgery just in case you cut yourself which could leave a site for infection.  Also, freshly shaved skin may become irritated if using the CHG soap.  Contact lenses, hearing aids and dentures may not be worn into surgery.  Do not bring valuables to the hospital. [facility] is not responsible for any missing/lost belongings or valuables.   Notify your doctor if there is any change in your medical condition (cold, fever, infection).  Wear comfortable clothing (specific to your surgery type) to the hospital.  After surgery, you can help prevent lung complications by doing breathing exercises.  Take deep breaths and cough every 1-2 hours. Your doctor may order a device called an Incentive Spirometer to help you take deep breaths. When coughing or sneezing, hold a pillow firmly against your incision with both hands. This is called splinting. Doing this helps protect your incision. It also decreases belly discomfort.  If you are being admitted to the hospital overnight, leave your suitcase in the car. After surgery it may be brought to your room.  If you are being discharged the day of  surgery, you will not be allowed to drive home. You will need a responsible adult (18 years or older) to drive you home and stay with you that night.   If you are taking public transportation, you will need to have a responsible adult (18 years or older) with you. Please confirm with your physician that it is acceptable to use public transportation.   Please call the [place] Dept. at [phone] if you have any questions about these  instructions.  Surgery Visitation Policy:  Patients undergoing a surgery or procedure may have one family member or support person with them as long as that person is not COVID-19 positive or experiencing its symptoms.  That person may remain in the waiting area during the procedure and may rotate out with other people.  Inpatient Visitation:    Visiting hours are 7 a.m. to 8 p.m. Up to two visitors ages 16+ are allowed at one time in a patient room. The visitors may rotate out with other people during the day. Visitors must check out when they leave, or other visitors will not be allowed. One designated support person may remain overnight. The visitor must pass COVID-19 screenings, use hand sanitizer when entering and exiting the patients room and wear a mask at all times, including in the patients room. Patients must also wear a mask when staff or their visitor are in the room. Masking is required regardless of vaccination status.

## 2021-03-19 ENCOUNTER — Other Ambulatory Visit
Admission: RE | Admit: 2021-03-19 | Discharge: 2021-03-19 | Disposition: A | Discharge status: Home / Self Care | Payer: Medicaid Other | Source: Ambulatory Visit | Attending: Surgery | Admitting: Surgery

## 2021-03-19 DIAGNOSIS — I10 Essential (primary) hypertension: Secondary | ICD-10-CM | POA: Diagnosis not present

## 2021-03-19 DIAGNOSIS — Z01812 Encounter for preprocedural laboratory examination: Secondary | ICD-10-CM | POA: Diagnosis not present

## 2021-03-25 ENCOUNTER — Ambulatory Visit: Payer: Medicaid Other | Admitting: Certified Registered"

## 2021-03-25 ENCOUNTER — Other Ambulatory Visit: Payer: Self-pay

## 2021-03-25 ENCOUNTER — Encounter
Admission: RE | Disposition: A | Discharge status: Home / Self Care | Payer: Self-pay | Source: Home / Self Care | Attending: Surgery

## 2021-03-25 ENCOUNTER — Encounter: Payer: Self-pay | Admitting: Surgery

## 2021-03-25 ENCOUNTER — Ambulatory Visit
Admission: RE | Admit: 2021-03-25 | Discharge: 2021-03-25 | Disposition: A | Discharge status: Home / Self Care | Payer: Medicaid Other | Attending: Surgery | Admitting: Surgery

## 2021-03-25 DIAGNOSIS — Z87891 Personal history of nicotine dependence: Secondary | ICD-10-CM | POA: Diagnosis not present

## 2021-03-25 DIAGNOSIS — I10 Essential (primary) hypertension: Secondary | ICD-10-CM | POA: Diagnosis not present

## 2021-03-25 DIAGNOSIS — R221 Localized swelling, mass and lump, neck: Secondary | ICD-10-CM | POA: Diagnosis not present

## 2021-03-25 DIAGNOSIS — D170 Benign lipomatous neoplasm of skin and subcutaneous tissue of head, face and neck: Secondary | ICD-10-CM | POA: Insufficient documentation

## 2021-03-25 HISTORY — PX: EXCISION MASS NECK: SHX6703

## 2021-03-25 SURGERY — EXCISION, MASS, NECK
Anesthesia: General | Laterality: Right

## 2021-03-25 MED ORDER — LIDOCAINE HCL (PF) 2 % IJ SOLN
INTRAMUSCULAR | Status: AC
  Filled 2021-03-25: qty 5

## 2021-03-25 MED ORDER — ACETAMINOPHEN 500 MG PO TABS: 1000.0000 mg | ORAL_TABLET | ORAL | Status: AC

## 2021-03-25 MED ORDER — GABAPENTIN 300 MG PO CAPS: 300.0000 mg | ORAL_CAPSULE | ORAL | Status: AC

## 2021-03-25 MED ORDER — FAMOTIDINE 20 MG PO TABS
ORAL_TABLET | ORAL | Status: AC
  Administered 2021-03-25: 07:00:00 20 mg via ORAL
  Filled 2021-03-25: qty 1

## 2021-03-25 MED ORDER — KETOROLAC TROMETHAMINE 30 MG/ML IJ SOLN
INTRAMUSCULAR | Status: AC
  Filled 2021-03-25: qty 1

## 2021-03-25 MED ORDER — CHLORHEXIDINE GLUCONATE CLOTH 2 % EX PADS: 6.0000 | MEDICATED_PAD | Freq: Once | CUTANEOUS | Status: DC

## 2021-03-25 MED ORDER — FAMOTIDINE 20 MG PO TABS: 20.0000 mg | ORAL_TABLET | Freq: Once | ORAL | Status: AC

## 2021-03-25 MED ORDER — ACETAMINOPHEN 500 MG PO TABS
ORAL_TABLET | ORAL | Status: AC
  Administered 2021-03-25: 07:00:00 1000 mg via ORAL
  Filled 2021-03-25: qty 2

## 2021-03-25 MED ORDER — FENTANYL CITRATE (PF) 100 MCG/2ML IJ SOLN
INTRAMUSCULAR | Status: DC | PRN
  Administered 2021-03-25: 50 ug via INTRAVENOUS
  Administered 2021-03-25 (×2): 25 ug via INTRAVENOUS

## 2021-03-25 MED ORDER — ONDANSETRON HCL 4 MG/2ML IJ SOLN
INTRAMUSCULAR | Status: AC
  Filled 2021-03-25: qty 2

## 2021-03-25 MED ORDER — DEXAMETHASONE SODIUM PHOSPHATE 10 MG/ML IJ SOLN
INTRAMUSCULAR | Status: DC | PRN
  Administered 2021-03-25: 10 mg via INTRAVENOUS

## 2021-03-25 MED ORDER — ROCURONIUM BROMIDE 10 MG/ML (PF) SYRINGE
PREFILLED_SYRINGE | INTRAVENOUS | Status: AC
  Filled 2021-03-25: qty 10

## 2021-03-25 MED ORDER — MIDAZOLAM HCL 2 MG/2ML IJ SOLN
INTRAMUSCULAR | Status: DC | PRN
  Administered 2021-03-25 (×2): 1 mg via INTRAVENOUS

## 2021-03-25 MED ORDER — PROPOFOL 10 MG/ML IV BOLUS
INTRAVENOUS | Status: AC
  Filled 2021-03-25: qty 20

## 2021-03-25 MED ORDER — ROCURONIUM BROMIDE 100 MG/10ML IV SOLN
INTRAVENOUS | Status: DC | PRN
  Administered 2021-03-25: 40 mg via INTRAVENOUS

## 2021-03-25 MED ORDER — FENTANYL CITRATE (PF) 100 MCG/2ML IJ SOLN
INTRAMUSCULAR | Status: AC
  Filled 2021-03-25: qty 2

## 2021-03-25 MED ORDER — OXYCODONE HCL 5 MG PO TABS: 5.0000 mg | ORAL_TABLET | Freq: Four times a day (QID) | ORAL | 0 refills | Status: DC | PRN

## 2021-03-25 MED ORDER — MIDAZOLAM HCL 2 MG/2ML IJ SOLN
INTRAMUSCULAR | Status: AC
  Filled 2021-03-25: qty 2

## 2021-03-25 MED ORDER — CHLORHEXIDINE GLUCONATE 0.12 % MT SOLN: 15.0000 mL | Freq: Once | OROMUCOSAL | Status: AC

## 2021-03-25 MED ORDER — LACTATED RINGERS IV SOLN: INTRAVENOUS | Status: DC

## 2021-03-25 MED ORDER — ONDANSETRON HCL 4 MG/2ML IJ SOLN
INTRAMUSCULAR | Status: DC | PRN
  Administered 2021-03-25: 4 mg via INTRAVENOUS

## 2021-03-25 MED ORDER — ONDANSETRON HCL 4 MG/2ML IJ SOLN: 4.0000 mg | Freq: Once | INTRAMUSCULAR | Status: DC | PRN

## 2021-03-25 MED ORDER — CEFAZOLIN SODIUM-DEXTROSE 2-4 GM/100ML-% IV SOLN
INTRAVENOUS | Status: AC
  Filled 2021-03-25: qty 100

## 2021-03-25 MED ORDER — IBUPROFEN 600 MG PO TABS: 600.0000 mg | ORAL_TABLET | Freq: Three times a day (TID) | ORAL | 1 refills | Status: AC | PRN

## 2021-03-25 MED ORDER — ORAL CARE MOUTH RINSE: 15.0000 mL | Freq: Once | OROMUCOSAL | Status: AC

## 2021-03-25 MED ORDER — GABAPENTIN 300 MG PO CAPS
ORAL_CAPSULE | ORAL | Status: AC
  Administered 2021-03-25: 07:00:00 300 mg via ORAL
  Filled 2021-03-25: qty 1

## 2021-03-25 MED ORDER — ACETAMINOPHEN 500 MG PO TABS: 1000.0000 mg | ORAL_TABLET | Freq: Four times a day (QID) | ORAL | Status: AC | PRN

## 2021-03-25 MED ORDER — SUGAMMADEX SODIUM 200 MG/2ML IV SOLN
INTRAVENOUS | Status: DC | PRN
  Administered 2021-03-25 (×2): 100 mg via INTRAVENOUS

## 2021-03-25 MED ORDER — LIDOCAINE HCL (CARDIAC) PF 100 MG/5ML IV SOSY
PREFILLED_SYRINGE | INTRAVENOUS | Status: DC | PRN
  Administered 2021-03-25: 60 mg via INTRAVENOUS

## 2021-03-25 MED ORDER — GLYCOPYRROLATE 0.2 MG/ML IJ SOLN
INTRAMUSCULAR | Status: DC | PRN
  Administered 2021-03-25: .2 mg via INTRAVENOUS

## 2021-03-25 MED ORDER — OXYCODONE HCL 5 MG PO TABS: 5.0000 mg | ORAL_TABLET | Freq: Once | ORAL | Status: DC | PRN

## 2021-03-25 MED ORDER — PHENYLEPHRINE HCL (PRESSORS) 10 MG/ML IV SOLN
INTRAVENOUS | Status: DC | PRN
  Administered 2021-03-25 (×3): 160 ug via INTRAVENOUS

## 2021-03-25 MED ORDER — 0.9 % SODIUM CHLORIDE (POUR BTL) OPTIME
TOPICAL | Status: DC | PRN
  Administered 2021-03-25: 08:00:00 100 mL

## 2021-03-25 MED ORDER — KETOROLAC TROMETHAMINE 30 MG/ML IJ SOLN
INTRAMUSCULAR | Status: DC | PRN
  Administered 2021-03-25: 30 mg via INTRAVENOUS

## 2021-03-25 MED ORDER — ACETAMINOPHEN 10 MG/ML IV SOLN: 1000.0000 mg | Freq: Once | INTRAVENOUS | Status: DC | PRN

## 2021-03-25 MED ORDER — PROPOFOL 10 MG/ML IV BOLUS
INTRAVENOUS | Status: DC | PRN
Start: 2021-03-25 — End: 2021-03-25
  Administered 2021-03-25: 150 mg via INTRAVENOUS

## 2021-03-25 MED ORDER — FENTANYL CITRATE (PF) 100 MCG/2ML IJ SOLN: 25.0000 ug | INTRAMUSCULAR | Status: DC | PRN

## 2021-03-25 MED ORDER — OXYCODONE HCL 5 MG/5ML PO SOLN: 5.0000 mg | Freq: Once | ORAL | Status: DC | PRN

## 2021-03-25 MED ORDER — BUPIVACAINE-EPINEPHRINE (PF) 0.5% -1:200000 IJ SOLN
INTRAMUSCULAR | Status: AC
  Filled 2021-03-25: qty 30

## 2021-03-25 MED ORDER — BUPIVACAINE-EPINEPHRINE 0.5% -1:200000 IJ SOLN
INTRAMUSCULAR | Status: DC | PRN
  Administered 2021-03-25: 30 mL

## 2021-03-25 MED ORDER — DEXAMETHASONE SODIUM PHOSPHATE 10 MG/ML IJ SOLN
INTRAMUSCULAR | Status: AC
  Filled 2021-03-25: qty 1

## 2021-03-25 MED ORDER — PHENYLEPHRINE HCL-NACL 20-0.9 MG/250ML-% IV SOLN
INTRAVENOUS | Status: AC
  Filled 2021-03-25: qty 250

## 2021-03-25 MED ORDER — CEFAZOLIN SODIUM-DEXTROSE 2-4 GM/100ML-% IV SOLN
2.0000 g | INTRAVENOUS | Status: AC
  Administered 2021-03-25: 08:00:00 2 g via INTRAVENOUS

## 2021-03-25 MED ORDER — PROPOFOL 500 MG/50ML IV EMUL
INTRAVENOUS | Status: AC
  Filled 2021-03-25: qty 50

## 2021-03-25 MED ORDER — CHLORHEXIDINE GLUCONATE 0.12 % MT SOLN
OROMUCOSAL | Status: AC
  Administered 2021-03-25: 07:00:00 15 mL via OROMUCOSAL
  Filled 2021-03-25: qty 15

## 2021-03-25 SURGICAL SUPPLY — 36 items
ADH SKN CLS APL DERMABOND .7 (GAUZE/BANDAGES/DRESSINGS) ×1
APL PRP STRL LF DISP 70% ISPRP (MISCELLANEOUS)
BLADE CLIPPER SURG (BLADE) ×3 IMPLANT
BLADE SURG 15 STRL LF DISP TIS (BLADE) ×1 IMPLANT
BLADE SURG 15 STRL SS (BLADE) ×3
CHLORAPREP W/TINT 26 (MISCELLANEOUS) ×1 IMPLANT
DECANTER SPIKE VIAL GLASS SM (MISCELLANEOUS) ×3 IMPLANT
DERMABOND ADVANCED (GAUZE/BANDAGES/DRESSINGS) ×2
DERMABOND ADVANCED .7 DNX12 (GAUZE/BANDAGES/DRESSINGS) ×1 IMPLANT
DRAPE LAPAROTOMY 77X122 PED (DRAPES) ×3 IMPLANT
DRSG TELFA 4X3 1S NADH ST (GAUZE/BANDAGES/DRESSINGS) ×1 IMPLANT
ELECT CAUTERY BLADE TIP 2.5 (TIP) ×3
ELECT REM PT RETURN 9FT ADLT (ELECTROSURGICAL) ×3
ELECTRODE CAUTERY BLDE TIP 2.5 (TIP) ×1 IMPLANT
ELECTRODE REM PT RTRN 9FT ADLT (ELECTROSURGICAL) ×1 IMPLANT
GAUZE 4X4 16PLY ~~LOC~~+RFID DBL (SPONGE) ×3 IMPLANT
GLOVE SURG SYN 7.0 (GLOVE) ×6 IMPLANT
GLOVE SURG SYN 7.0 PF PI (GLOVE) ×1 IMPLANT
GLOVE SURG SYN 7.5  E (GLOVE) ×4
GLOVE SURG SYN 7.5 E (GLOVE) ×2 IMPLANT
GLOVE SURG SYN 7.5 PF PI (GLOVE) ×1 IMPLANT
GOWN STRL REUS W/ TWL LRG LVL3 (GOWN DISPOSABLE) ×2 IMPLANT
GOWN STRL REUS W/TWL LRG LVL3 (GOWN DISPOSABLE) ×6
KIT TURNOVER KIT A (KITS) ×3 IMPLANT
MANIFOLD NEPTUNE II (INSTRUMENTS) ×3 IMPLANT
NEEDLE HYPO 22GX1.5 SAFETY (NEEDLE) ×3 IMPLANT
NS IRRIG 500ML POUR BTL (IV SOLUTION) ×3 IMPLANT
PACK BASIN MINOR ARMC (MISCELLANEOUS) ×3 IMPLANT
SPONGE KITTNER 5P (MISCELLANEOUS) ×3 IMPLANT
SUT MNCRL 4-0 (SUTURE) ×3
SUT MNCRL 4-0 27XMFL (SUTURE) ×1
SUT VIC AB 3-0 SH 27 (SUTURE) ×3
SUT VIC AB 3-0 SH 27X BRD (SUTURE) ×1 IMPLANT
SUTURE MNCRL 4-0 27XMF (SUTURE) ×1 IMPLANT
SYR 10ML LL (SYRINGE) ×6 IMPLANT
WATER STERILE IRR 500ML POUR (IV SOLUTION) ×1 IMPLANT

## 2021-03-25 NOTE — Discharge Instructions (Signed)

## 2021-03-25 NOTE — Anesthesia Preprocedure Evaluation (Addendum)
Anesthesia Evaluation  Patient identified by MRN, date of birth, ID band Patient awake    Reviewed: Allergy & Precautions, NPO status , Patient's Chart, lab work & pertinent test results  History of Anesthesia Complications Negative for: history of anesthetic complications  Airway Mallampati: III   Neck ROM: Full    Dental   Missing many teeth:   Pulmonary Current Smoker (2 cigarettes per day, attempting to quit) and Patient abstained from smoking.,    Pulmonary exam normal breath sounds clear to auscultation       Cardiovascular hypertension, Normal cardiovascular exam Rhythm:Regular Rate:Normal  ECG 03/19/21:  Normal sinus rhythm Minimal voltage criteria for LVH, may be normal variant ( Sokolow-Lyon ) Borderline ECG When compared with ECG of 01-Dec-2013 10:37, No significant change was found   Neuro/Psych  Headaches,    GI/Hepatic negative GI ROS,   Endo/Other  negative endocrine ROS  Renal/GU negative Renal ROS     Musculoskeletal  (+) Arthritis ,   Abdominal   Peds  Hematology negative hematology ROS (+)   Anesthesia Other Findings   Reproductive/Obstetrics                            Anesthesia Physical Anesthesia Plan  ASA: 2  Anesthesia Plan: General   Post-op Pain Management:    Induction: Intravenous  PONV Risk Score and Plan: 2 and Ondansetron, Dexamethasone and Treatment may vary due to age or medical condition  Airway Management Planned: Oral ETT  Additional Equipment:   Intra-op Plan:   Post-operative Plan: Extubation in OR  Informed Consent: I have reviewed the patients History and Physical, chart, labs and discussed the procedure including the risks, benefits and alternatives for the proposed anesthesia with the patient or authorized representative who has indicated his/her understanding and acceptance.     Dental advisory given  Plan Discussed with:  CRNA  Anesthesia Plan Comments: (Patient consented for risks of anesthesia including but not limited to:  - adverse reactions to medications - damage to eyes, teeth, lips or other oral mucosa - nerve damage due to positioning  - sore throat or hoarseness - damage to heart, brain, nerves, lungs, other parts of body or loss of life  Informed patient about role of CRNA in peri- and intra-operative care.  Patient voiced understanding.)        Anesthesia Quick Evaluation

## 2021-03-25 NOTE — Anesthesia Procedure Notes (Signed)
Procedure Name: Intubation Date/Time: 03/25/2021 7:36 AM Performed by: Loletha Grayer, CRNA Pre-anesthesia Checklist: Patient identified, Patient being monitored, Timeout performed, Emergency Drugs available and Suction available Patient Re-evaluated:Patient Re-evaluated prior to induction Oxygen Delivery Method: Circle system utilized Preoxygenation: Pre-oxygenation with 100% oxygen Induction Type: IV induction Ventilation: Mask ventilation without difficulty Laryngoscope Size: 3 and McGraph Grade View: Grade I Tube type: Oral Tube size: 7.0 mm Number of attempts: 1 Airway Equipment and Method: Stylet Placement Confirmation: ETT inserted through vocal cords under direct vision, positive ETCO2 and breath sounds checked- equal and bilateral Secured at: 21 cm Tube secured with: Tape Dental Injury: Teeth and Oropharynx as per pre-operative assessment

## 2021-03-25 NOTE — Transfer of Care (Signed)
Immediate Anesthesia Transfer of Care Note  Patient: Teresa Combs  Procedure(s) Performed: EXCISION MASS NECK, posterior right (Right)  Patient Location: PACU  Anesthesia Type:General  Level of Consciousness: awake and alert   Airway & Oxygen Therapy: Patient Spontanous Breathing and Patient connected to face mask oxygen  Post-op Assessment: Report given to RN and Post -op Vital signs reviewed and stable  Post vital signs: Reviewed and stable  Last Vitals:  Vitals Value Taken Time  BP 113/80 03/25/21 0845  Temp 35.9 C 03/25/21 0844  Pulse 86 03/25/21 0848  Resp 13 03/25/21 0848  SpO2 100 % 03/25/21 0848  Vitals shown include unvalidated device data.  Last Pain:  Vitals:   03/25/21 0844  TempSrc:   PainSc: 0-No pain      Patients Stated Pain Goal: 0 (12/87/86 7672)  Complications: No notable events documented.

## 2021-03-25 NOTE — Op Note (Signed)
°  Procedure Date:  03/25/2021  Pre-operative Diagnosis:  Posterior right neck lipoma  Post-operative Diagnosis: Posterior right neck lipoma, 4 cm.  Procedure:  Excision of posterior right neck lipoma  Surgeon:  Melvyn Neth, MD  Assistant:  Rosita Fire, PA-S  Anesthesia:  General endotracheal  Estimated Blood Loss:  5 ml  Specimens:  right neck lipoma  Complications:  None  Indications for Procedure:  This is a 61 y.o. female with diagnosis of a symptomatic posterior right neck lipoma.  The patient wishes to have this excised. The risks of bleeding, abscess or infection, injury to surrounding structures, and need for further procedures were all discussed with the patient and she was willing to proceed.  Description of Procedure: The patient was correctly identified in the preoperative area and brought into the operating room.  The patient was placed supine with VTE prophylaxis in place.  Appropriate time-outs were performed.  Anesthesia was induced and the patient was intubated.  Appropriate antibiotics were infused.  The patient's right neck was prepped and draped in usual sterile fashion.  A 4 cm  incision was made over the lipoma, and cautery was used to dissect down the subcutaneous tissue to the lipoma itself.  Skin flaps were created using cautery as well, and then the lipoma was excised using cautery, intact.  It was sent off to pathology.  The cavity was then irrigated and hemostasis was assured with cautery.  Local anesthetic was infused intradermally.  The wound was then closed in two layers using 3-0 Vicryl and 4-0 Monocryl.  The incision was cleaned and sealed with DermaBond.  The patient was then emerged from anesthesia, extubated, and brought to the recovery room for further management.    The patient tolerated the procedure well and all counts were correct at the end of the case.   Melvyn Neth, MD

## 2021-03-25 NOTE — Anesthesia Postprocedure Evaluation (Signed)
Anesthesia Post Note  Patient: Teresa Combs  Procedure(s) Performed: EXCISION MASS NECK, posterior right (Right)  Patient location during evaluation: PACU Anesthesia Type: General Level of consciousness: awake and alert, oriented and patient cooperative Pain management: pain level controlled Vital Signs Assessment: post-procedure vital signs reviewed and stable Respiratory status: spontaneous breathing, nonlabored ventilation and respiratory function stable Cardiovascular status: blood pressure returned to baseline and stable Postop Assessment: adequate PO intake Anesthetic complications: no   No notable events documented.   Last Vitals:  Vitals:   03/25/21 0930 03/25/21 0942  BP: (!) 150/100 (!) 144/90  Pulse: 76   Resp: 16   Temp: (!) 36.1 C   SpO2: 99%     Last Pain:  Vitals:   03/25/21 0930  TempSrc: Temporal  PainSc: 0-No pain                 Darrin Nipper

## 2021-03-25 NOTE — Interval H&P Note (Signed)
History and Physical Interval Note:  03/25/2021 7:09 AM  Teresa Combs  has presented today for surgery, with the diagnosis of posterior right neck mass.  The various methods of treatment have been discussed with the patient and family. After consideration of risks, benefits and other options for treatment, the patient has consented to  Procedure(s) with comments: EXCISION MASS NECK, posterior right (Right) - Provider requesting 1.5 hours / 90 minutes for procedure. as a surgical intervention.  The patient's history has been reviewed, patient examined, no change in status, stable for surgery.  I have reviewed the patient's chart and labs.  Questions were answered to the patient's satisfaction.     Caliegh Middlekauff

## 2021-03-26 ENCOUNTER — Encounter: Payer: Self-pay | Admitting: Surgery

## 2021-03-26 LAB — SURGICAL PATHOLOGY

## 2021-04-03 ENCOUNTER — Other Ambulatory Visit: Payer: Self-pay

## 2021-04-03 ENCOUNTER — Ambulatory Visit: Payer: Self-pay | Admitting: Gerontology

## 2021-04-03 ENCOUNTER — Encounter: Payer: Self-pay | Admitting: Gerontology

## 2021-04-03 VITALS — BP 137/96 | HR 84 | Temp 97.9°F | Ht 65.0 in | Wt 121.3 lb

## 2021-04-03 DIAGNOSIS — Z8739 Personal history of other diseases of the musculoskeletal system and connective tissue: Secondary | ICD-10-CM | POA: Insufficient documentation

## 2021-04-03 DIAGNOSIS — Z7689 Persons encountering health services in other specified circumstances: Secondary | ICD-10-CM

## 2021-04-03 DIAGNOSIS — I10 Essential (primary) hypertension: Secondary | ICD-10-CM

## 2021-04-03 MED ORDER — AMLODIPINE BESYLATE 5 MG PO TABS
5.0000 mg | ORAL_TABLET | Freq: Every day | ORAL | 3 refills | Status: DC
  Filled 2021-04-03: qty 30, 30d supply, fill #0
  Filled 2021-06-05: qty 90, 90d supply, fill #1

## 2021-04-03 NOTE — Patient Instructions (Signed)

## 2021-04-03 NOTE — Progress Notes (Signed)
New Patient Office Visit  Subjective:  Patient ID: Teresa Combs, female    DOB: 01/26/1961  Age: 61 y.o. MRN: [id number]  CC:  Chief Complaint  Patient presents with   Establish Care    Needs primary care doctor due to hypertension. Referred from hospital. Arthritis in knees and wrists.    HPI Teresa Combs  is a 61 year old female who has history of hypertension, arthritis, headache and presents to establish care. She has a history of hypertension and takes 5 mg Amlodipine. She states that she's compliant with her medications, denies side effects and continues to make healthy lifestyle changes. She had excision of posterior right neck lipoma on 03/25/21 by Dr Hampton Abbot. Dressing to site is intact, she denies pain, drainage nor erythema. She also has a history of Arthritis, taking Ibuprofen 600 mg as needed relieves her symptoms. Overall, she states that she's doing well and offers no further complaint.  Past Medical History:  Diagnosis Date   Arthritis    Headache    Hypertension     Past Surgical History:  Procedure Laterality Date   BREAST CYST ASPIRATION Bilateral 2009   neg   EXCISION MASS NECK Right 03/25/2021   Procedure: EXCISION MASS NECK, posterior right;  Surgeon: Olean Ree, MD;  Location: [facility] ORS;  Service: General;  Laterality: Right;  Provider requesting 1.5 hours / 90 minutes for procedure.    Family History  Problem Relation Age of Onset   Hyperlipidemia Mother    Hypertension Mother    Diabetes Sister    Diabetes Sister    Depression Sister    Cancer Sister        breast   Healthy Sister    Healthy Sister    Breast cancer Neg Hx     Social History   Socioeconomic History   Marital status: Single    Spouse name: Not on file   Number of children: Not on file   Years of education: Not on file   Highest education level: Not on file  Occupational History   Not on file  Tobacco Use   Smoking status: Every Day    Packs/day: 0.25     Years: 10.00    Pack years: 2.50    Types: Cigarettes   Smokeless tobacco: Never   Tobacco comments:    2 cigarettes a day, maybe one  Vaping Use   Vaping Use: Former   Quit date: 08/14/2020  Substance and Sexual Activity   Alcohol use: Yes    Alcohol/week: 2.0 standard drinks    Types: 2 Cans of beer per week    Comment: occassionally   Drug use: Not Currently   Sexual activity: Not on file  Other Topics Concern   Not on file  Social History Narrative   Not on file   Social Determinants of Health   Financial Resource Strain: Not on file  Food Insecurity: No Food Insecurity   Worried About Running Out of Food in the Last Year: Never true   Ran Out of Food in the Last Year: Never true  Transportation Needs: Unmet Transportation Needs   Lack of Transportation (Medical): Yes   Lack of Transportation (Non-Medical): Yes  Physical Activity: Not on file  Stress: Not on file  Social Connections: Not on file  Intimate Partner Violence: Not on file    ROS Review of Systems  Constitutional: Negative.   HENT: Negative.    Eyes: Negative.   Respiratory: Negative.  Cardiovascular: Negative.   Gastrointestinal: Negative.   Endocrine: Negative.   Genitourinary: Negative.   Musculoskeletal:  Positive for arthralgias (chronic arthritis).  Skin: Negative.   Neurological: Negative.   Hematological: Negative.   Psychiatric/Behavioral: Negative.     Objective:   Today's Vitals: BP (!) 137/96 (BP Location: Right Arm, Patient Position: Sitting, Cuff Size: Normal)    Pulse 84    Temp 97.9 F (36.6 C)    Ht 5\' 5"  (1.651 m)    Wt 121 lb 4.8 oz (55 kg)    LMP 08/20/2016    SpO2 96%    BMI 20.19 kg/m   Physical Exam HENT:     Head: Normocephalic and atraumatic.     Nose: Nose normal.     Mouth/Throat:     Mouth: Mucous membranes are moist.  Eyes:     Extraocular Movements: Extraocular movements intact.     Conjunctiva/sclera: Conjunctivae normal.     Pupils: Pupils are equal,  round, and reactive to light.  Cardiovascular:     Rate and Rhythm: Normal rate and regular rhythm.     Pulses: Normal pulses.     Heart sounds: Normal heart sounds.  Pulmonary:     Effort: Pulmonary effort is normal.     Breath sounds: Normal breath sounds.  Abdominal:     General: Abdomen is flat. Bowel sounds are normal.     Palpations: Abdomen is soft.  Genitourinary:    Comments: Deferred per patient Musculoskeletal:        General: No swelling. Normal range of motion.     Cervical back: Normal range of motion.  Skin:    General: Skin is warm.  Neurological:     General: No focal deficit present.     Mental Status: She is alert and oriented to person, place, and time. Mental status is at baseline.  Psychiatric:        Mood and Affect: Mood normal.        Behavior: Behavior normal.        Thought Content: Thought content normal.        Judgment: Judgment normal.    Assessment & Plan:    1. Essential hypertension - Her blood pressure is under control, she will continue on current medication, DASH diet and exercise as tolerated. - amLODipine (NORVASC) 5 MG tablet; Take 1 tablet (5 mg total) by mouth daily.  Dispense: 30 tablet; Refill: 3  2. History of arthritis - She will continue on Ibuprofen 600 mg every 12 hours alternating with Tylenol. She was educated on medication side effects and advised to notify clinic. She was advised to notify clinic for worsening symptoms.  3. Encounter to establish care - Routine labs will be checked. - Lipid panel; Future - HgB A1c; Future      Follow-up: Return in about 3 months (around 07/01/2021), or if symptoms worsen or fail to improve.   Teresa Arriaga Jerold Coombe, NP

## 2021-04-07 ENCOUNTER — Ambulatory Visit (INDEPENDENT_AMBULATORY_CARE_PROVIDER_SITE_OTHER): Payer: Self-pay | Admitting: Surgery

## 2021-04-07 ENCOUNTER — Other Ambulatory Visit: Payer: Self-pay

## 2021-04-07 ENCOUNTER — Encounter: Payer: Self-pay | Admitting: Surgery

## 2021-04-07 VITALS — BP 148/99 | HR 66 | Temp 98.8°F | Ht 65.0 in | Wt 118.8 lb

## 2021-04-07 DIAGNOSIS — R221 Localized swelling, mass and lump, neck: Secondary | ICD-10-CM

## 2021-04-07 DIAGNOSIS — Z09 Encounter for follow-up examination after completed treatment for conditions other than malignant neoplasm: Secondary | ICD-10-CM

## 2021-04-07 NOTE — Progress Notes (Signed)
04/07/2021  HPI: Teresa Combs is a 61 y.o. female s/p excision of right posterior neck mass on 03/25/2021.  Final pathology resulted in a lipoma.  Patient presents today for follow-up.  She reports that she has been doing very well with no significant pain or swelling of the area but only having some itching.  Vital signs: BP (!) 148/99    Pulse 66    Temp 98.8 F (37.1 C) (Oral)    Ht 5\' 5"  (1.651 m)    Wt 118 lb 12.8 oz (53.9 kg)    LMP 08/20/2016    SpO2 99%    BMI 19.77 kg/m    Physical Exam: Constitutional: No acute distress Skin: Right posterior neck incision is healing well and is clean, dry, intact with no significant swelling or seroma formation.  Dermabond still in place but starting to peel off.  Assessment/Plan: This is a 61 y.o. female s/p excision of right posterior neck lipoma on 03/25/2021.  - Patient is healing well without any complications at this point. - Reviewed pathology with her again showing a lipoma. - Recommend that she may use Benadryl ointment over the wound area to help with any itching. - Follow-up as needed.   Melvyn Neth, [facility]

## 2021-04-07 NOTE — Patient Instructions (Addendum)
°  You may use Benadryl cream as needed.   If you have any concerns or questions, please feel free to call our office.   Excision of Skin Lesions, Care After The following information offers guidance on how to care for yourself after your procedure. Your health care provider may also give you more specific instructions. If you have problems or questions, contact your health care provider. What can I expect after the procedure? After your procedure, it is common to have: Soreness or mild pain. Some redness and swelling. Follow these instructions at home: Excision site care  Follow instructions from your health care provider about how to take care of your excision site. Make sure you: Wash your hands with soap and water for at least 20 seconds before and after you change your bandage (dressing). If soap and water are not available, use hand sanitizer. Change your dressing as told by your health care provider. Leave stitches (sutures), skin glue, or adhesive strips in place. These skin closures may need to stay in place for 2 weeks or longer. If adhesive strip edges start to loosen and curl up, you may trim the loose edges. Do not remove adhesive strips completely unless your health care provider tells you to do that. Check the excision area every day for signs of infection. Watch for: More redness, swelling, or pain. Fluid or blood. Warmth. Pus or a bad smell. Keep the site clean, dry, and protected for at least 48 hours. For bleeding, apply gentle but firm pressure to the area using a folded towel for 20 minutes. Do not take baths, swim, or use a hot tub until your health care provider approves. Ask your health care provider if you may take showers. You may only be allowed to take sponge baths. General instructions Take over-the-counter and prescription medicines only as told by your health care provider. Follow instructions from your health care provider about how to minimize scarring.  Scarring should lessen over time. Avoid sun exposure until the area has healed. Use sunscreen to protect the area from the sun after it has healed. Avoid high-impact exercise and activities until the sutures are removed or the area heals. Keep all follow-up visits. This is important. Contact a health care provider if: You have more redness, swelling, or pain around your excision site. You have fluid or blood coming from your excision site. Your excision site feels warm to the touch. You have pus or a bad smell coming from your excision site. You have a fever. You have pain that does not improve in 2-3 days after your procedure. Get help right away if: You have bleeding that does not stop with pressure or a dressing. Your wound opens up. Summary Take over-the-counter and prescription medicines only as told by your health care provider. Change your dressing as told by your health care provider. Contact a health care provider if you have redness, swelling, pain, or other signs of infection around your excision site. Keep all follow-up visits. This is important. This information is not intended to replace advice given to you by your health care provider. Make sure you discuss any questions you have with your health care provider. Document Revised: 09/17/2020 Document Reviewed: 09/17/2020 Elsevier Patient Education  [place].

## 2021-04-09 ENCOUNTER — Other Ambulatory Visit: Payer: Medicaid Other

## 2021-04-16 ENCOUNTER — Other Ambulatory Visit: Payer: Medicaid Other

## 2021-04-18 DIAGNOSIS — I10 Essential (primary) hypertension: Secondary | ICD-10-CM | POA: Diagnosis not present

## 2021-04-18 DIAGNOSIS — Z1159 Encounter for screening for other viral diseases: Secondary | ICD-10-CM | POA: Diagnosis not present

## 2021-04-18 DIAGNOSIS — F331 Major depressive disorder, recurrent, moderate: Secondary | ICD-10-CM | POA: Diagnosis not present

## 2021-04-18 DIAGNOSIS — Z1389 Encounter for screening for other disorder: Secondary | ICD-10-CM | POA: Diagnosis not present

## 2021-04-18 DIAGNOSIS — M25511 Pain in right shoulder: Secondary | ICD-10-CM | POA: Diagnosis not present

## 2021-04-18 DIAGNOSIS — Z131 Encounter for screening for diabetes mellitus: Secondary | ICD-10-CM | POA: Diagnosis not present

## 2021-04-23 ENCOUNTER — Other Ambulatory Visit: Payer: Medicaid Other

## 2021-05-01 ENCOUNTER — Telehealth: Payer: Self-pay

## 2021-05-01 NOTE — Telephone Encounter (Signed)
Faxed medical records to Disability Determination at [phone]. ?

## 2021-05-05 ENCOUNTER — Other Ambulatory Visit: Payer: Self-pay

## 2021-05-06 ENCOUNTER — Other Ambulatory Visit: Payer: Self-pay

## 2021-05-14 ENCOUNTER — Other Ambulatory Visit: Payer: Self-pay

## 2021-05-26 ENCOUNTER — Telehealth: Payer: Self-pay

## 2021-05-26 NOTE — Telephone Encounter (Signed)
Faxed medical records to Disability Determination Services at [phone]. 

## 2021-06-02 ENCOUNTER — Ambulatory Visit: Payer: Medicaid Other | Admitting: Pharmacy Technician

## 2021-06-02 DIAGNOSIS — Z79899 Other long term (current) drug therapy: Secondary | ICD-10-CM

## 2021-06-03 NOTE — Progress Notes (Signed)
Completed [facility] application and contract.  Patient agreed to all terms of the [facility] contract.   ? ?Patient approved to receive medication assistance at [facility] until time for re-certification in 2024, and as long as eligibility criteria continues to be met.   ? ?Provided patient with community resource material based on her particular needs.   ? ?Ambers Iyengar J. Laelle Bridgett ?Care Manager ?[facility] ?

## 2021-06-05 ENCOUNTER — Other Ambulatory Visit: Payer: Self-pay

## 2021-06-06 ENCOUNTER — Other Ambulatory Visit: Payer: Self-pay

## 2021-07-03 ENCOUNTER — Ambulatory Visit: Payer: Medicaid Other | Admitting: Gerontology

## 2021-07-04 ENCOUNTER — Other Ambulatory Visit: Payer: Self-pay

## 2021-07-26 ENCOUNTER — Other Ambulatory Visit: Payer: Self-pay

## 2021-07-31 DIAGNOSIS — Z419 Encounter for procedure for purposes other than remedying health state, unspecified: Secondary | ICD-10-CM | POA: Diagnosis not present

## 2021-08-27 ENCOUNTER — Other Ambulatory Visit: Payer: Self-pay

## 2021-08-27 ENCOUNTER — Other Ambulatory Visit: Payer: Self-pay | Admitting: Gerontology

## 2021-08-27 DIAGNOSIS — I10 Essential (primary) hypertension: Secondary | ICD-10-CM

## 2021-08-27 MED ORDER — AMLODIPINE BESYLATE 5 MG PO TABS
5.0000 mg | ORAL_TABLET | Freq: Every day | ORAL | 0 refills | Status: AC
  Filled 2021-08-27 – 2021-09-22 (×2): qty 30, 30d supply, fill #0

## 2021-08-30 DIAGNOSIS — Z419 Encounter for procedure for purposes other than remedying health state, unspecified: Secondary | ICD-10-CM | POA: Diagnosis not present

## 2021-09-01 ENCOUNTER — Other Ambulatory Visit: Payer: Self-pay | Admitting: Pharmacy Technician

## 2021-09-01 NOTE — Progress Notes (Signed)
Patient has prescription drug coverage with [place] [employer].  Teresa Combs Patient Advocate Specialist [place]

## 2021-09-16 ENCOUNTER — Other Ambulatory Visit: Payer: Self-pay

## 2021-09-19 ENCOUNTER — Ambulatory Visit: Payer: Self-pay | Admitting: *Deleted

## 2021-09-19 NOTE — Telephone Encounter (Signed)
Summary: questions regarding high bp   Patient has questions regarding her high bp status      Voicemail left.

## 2021-09-19 NOTE — Telephone Encounter (Signed)
  Chief Complaint: Patient feels she needs different BP medication- she is still getting high numbers Symptoms: vision changes, hot flashes, occasional HA Frequency:   Pertinent Negatives: Patient denies chest pain, difficulty breathing, headache, weakness Disposition: '[]'$ ED /'[x]'$ Urgent Care (no appt availability in office) / '[]'$ Appointment(In office/virtual)/ '[]'$  [facility]/ '[]'$ Home Care/ '[]'$ Refused Recommended Disposition /'[]'$ [facility]/ '[]'$  Follow-up with PCP Additional Notes: Patient was seen at [facility] in [place] and she has NP appointment 01/26/22 with new provider. Patient advised UC for BPfollow up until she can establish care with PCP    Reason for Disposition  [3] Systolic BP  >= 716 OR Diastolic >= 80 AND [9] taking BP medications  Answer Assessment - Initial Assessment Questions 1. NAME of MEDICINE: "What medicine(s) are you calling about?"     Amlodipine '5mg'$  2. QUESTION: "What is your question?" (e.g., double dose of medicine, side effect)     Patient feels medication is not helping low BP 3. PRESCRIBER: "Who prescribed the medicine?" Reason: if prescribed by specialist, call should be referred to that group.     Clinic 4. SYMPTOMS: "Do you have any symptoms?" If Yes, ask: "What symptoms are you having?"  "How bad are the symptoms (e.g., mild, moderate, severe)     Weight loss, blurred vision, hot flashes 5. PREGNANCY:  "Is there any chance that you are pregnant?" "When was your last menstrual period?"  Answer Assessment - Initial Assessment Questions 1. BLOOD PRESSURE: "What is the blood pressure?" "Did you take at least two measurements 5 minutes apart?"     169/113, 170/112, 144/97 ,P81 2. ONSET: "When did you take your blood pressure?"     Yesterday, today- 8am 3. HOW: "How did you take your blood pressure?" (e.g., automatic home BP monitor, visiting nurse)     Automatic cuff- wrist 4. HISTORY: "Do you have a history of high blood  pressure?"     yes 5. MEDICINES: "Are you taking any medicines for blood pressure?" "Have you missed any doses recently?"     yes 6. OTHER SYMPTOMS: "Do you have any symptoms?" (e.g., blurred vision, chest pain, difficulty breathing, headache, weakness)     Blurred vision 7. PREGNANCY: "Is there any chance you are pregnant?" "When was your last menstrual period?"  Protocols used: Medication Question Call-A-AH, Blood Pressure - High-A-AH

## 2021-09-22 ENCOUNTER — Other Ambulatory Visit: Payer: Self-pay

## 2021-09-30 DIAGNOSIS — Z419 Encounter for procedure for purposes other than remedying health state, unspecified: Secondary | ICD-10-CM | POA: Diagnosis not present

## 2021-10-31 DIAGNOSIS — Z419 Encounter for procedure for purposes other than remedying health state, unspecified: Secondary | ICD-10-CM | POA: Diagnosis not present

## 2021-11-04 DIAGNOSIS — Z7689 Persons encountering health services in other specified circumstances: Secondary | ICD-10-CM | POA: Diagnosis not present

## 2021-11-18 DIAGNOSIS — Z7689 Persons encountering health services in other specified circumstances: Secondary | ICD-10-CM | POA: Diagnosis not present

## 2021-11-30 DIAGNOSIS — Z419 Encounter for procedure for purposes other than remedying health state, unspecified: Secondary | ICD-10-CM | POA: Diagnosis not present

## 2021-12-23 DIAGNOSIS — Z7689 Persons encountering health services in other specified circumstances: Secondary | ICD-10-CM | POA: Diagnosis not present

## 2021-12-31 DIAGNOSIS — Z419 Encounter for procedure for purposes other than remedying health state, unspecified: Secondary | ICD-10-CM | POA: Diagnosis not present

## 2022-01-26 ENCOUNTER — Ambulatory Visit: Payer: Medicaid Other | Admitting: Nurse Practitioner

## 2022-01-26 NOTE — Progress Notes (Deleted)
LMP 08/20/2016    Subjective:    Patient ID: Octavia Heir, female    DOB: [birth year], 61 y.o.   MRN: [id number]  HPI: KIMRA KANTOR is a 61 y.o. female  No chief complaint on file.  Patient presents to clinic to establish care with new PCP.  Introduced to Designer, jewellery role and practice setting.  All questions answered.  Discussed provider/patient relationship and expectations.  Patient reports a history of ***. Patient denies a history of: Hypertension, Elevated Cholesterol, Diabetes, Thyroid problems, Depression, Anxiety, Neurological problems, and Abdominal problems.   Active Ambulatory Problems    Diagnosis Date Noted   Mass of right side of neck    Essential hypertension 04/03/2021   History of arthritis 04/03/2021   Resolved Ambulatory Problems    Diagnosis Date Noted   No Resolved Ambulatory Problems   Past Medical History:  Diagnosis Date   Arthritis    Headache    Hypertension    Past Surgical History:  Procedure Laterality Date   BREAST CYST ASPIRATION Bilateral 2009   neg   EXCISION MASS NECK Right 03/25/2021   Procedure: EXCISION MASS NECK, posterior right;  Surgeon: Olean Ree, MD;  Location: [facility] ORS;  Service: General;  Laterality: Right;  Provider requesting 1.5 hours / 90 minutes for procedure.   Family History  Problem Relation Age of Onset   Hyperlipidemia Mother    Hypertension Mother    Diabetes Sister    Diabetes Sister    Depression Sister    Cancer Sister        breast   Healthy Sister    Healthy Sister    Breast cancer Neg Hx      Review of Systems  Per HPI unless specifically indicated above     Objective:    LMP 08/20/2016   Wt Readings from Last 3 Encounters:  04/07/21 118 lb 12.8 oz (53.9 kg)  04/03/21 121 lb 4.8 oz (55 kg)  03/25/21 122 lb (55.3 kg)    Physical Exam  Results for orders placed or performed during the hospital encounter of 03/25/21  Surgical pathology  Result Value Ref Range    SURGICAL PATHOLOGY      SURGICAL PATHOLOGY CASE: [id number] PATIENT: Modena Morrow Surgical Pathology Report     Specimen Submitted: A. Lipoma, right neck  Clinical History: Posterior right neck mass      DIAGNOSIS: A. LIPOMA, RIGHT NECK; EXCISION: - BENIGN MATURE ADIPOSE TISSUE CONSISTENT WITH CLINICAL IMPRESSION OF LIPOMA.  GROSS DESCRIPTION: A. Labeled: Right neck lipoma Received: Fresh Collection time: 8:13 AM on 03/25/2021 Placed into formalin time: 9:13 AM on 03/25/2021 Tissue fragment(s): 1 Size: 4.8 x 3.4 x 1.9 cm Description: Received is a portion of yellow lobulated adipose tissue. The specimen is inked green and serially sectioned revealing yellow lobulated adipose tissue admixed with a scant amount of pink fine membranous tissue.  No distinct masses or lesions are grossly identified. Representative sections (1/cm) are submitted in cassettes 1-2.  RB 03/25/2021  Final Diagnosis performed by Quay Burow, MD.   Electronically signed 03/26/2021 12:19:21PM The  electronic signature indicates that the named Attending Pathologist has evaluated the specimen Technical component performed at [facility], [address] [place], [place] [address] Lab: [phone] Dir: Rush Farmer, MD, MMM  Professional component performed at [facility], [facility], [place], [place], [place] [address] Lab: [phone] Dir: Kathi Simpers, MD       Assessment & Plan:   Problem List Items Addressed  This Visit       Cardiovascular and Mediastinum   Essential hypertension - Primary     Follow up plan: No follow-ups on file.

## 2022-01-30 DIAGNOSIS — Z419 Encounter for procedure for purposes other than remedying health state, unspecified: Secondary | ICD-10-CM | POA: Diagnosis not present

## 2022-03-02 DIAGNOSIS — Z419 Encounter for procedure for purposes other than remedying health state, unspecified: Secondary | ICD-10-CM | POA: Diagnosis not present

## 2022-03-28 ENCOUNTER — Ambulatory Visit (INDEPENDENT_AMBULATORY_CARE_PROVIDER_SITE_OTHER): Payer: Medicaid Other

## 2022-03-28 ENCOUNTER — Ambulatory Visit
Admission: EM | Admit: 2022-03-28 | Discharge: 2022-03-28 | Disposition: A | Discharge status: Home / Self Care | Payer: Medicaid Other | Attending: Family Medicine | Admitting: Family Medicine

## 2022-03-28 DIAGNOSIS — M542 Cervicalgia: Secondary | ICD-10-CM

## 2022-03-28 DIAGNOSIS — M25511 Pain in right shoulder: Secondary | ICD-10-CM

## 2022-03-28 DIAGNOSIS — M546 Pain in thoracic spine: Secondary | ICD-10-CM | POA: Diagnosis not present

## 2022-03-28 DIAGNOSIS — S161XXA Strain of muscle, fascia and tendon at neck level, initial encounter: Secondary | ICD-10-CM

## 2022-03-28 MED ORDER — KETOROLAC TROMETHAMINE 60 MG/2ML IM SOLN
30.0000 mg | Freq: Once | INTRAMUSCULAR | Status: AC
  Administered 2022-03-28: 30 mg via INTRAMUSCULAR

## 2022-03-28 MED ORDER — CYCLOBENZAPRINE HCL 10 MG PO TABS: 10.0000 mg | ORAL_TABLET | Freq: Three times a day (TID) | ORAL | 0 refills | Status: AC | PRN

## 2022-03-28 MED ORDER — NAPROXEN 500 MG PO TABS: 500.0000 mg | ORAL_TABLET | Freq: Two times a day (BID) | ORAL | 0 refills | Status: AC
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
## 2022-04-02 DIAGNOSIS — Z419 Encounter for procedure for purposes other than remedying health state, unspecified: Secondary | ICD-10-CM | POA: Diagnosis not present

## 2022-05-01 DIAGNOSIS — Z419 Encounter for procedure for purposes other than remedying health state, unspecified: Secondary | ICD-10-CM | POA: Diagnosis not present

## 2022-06-01 DIAGNOSIS — Z419 Encounter for procedure for purposes other than remedying health state, unspecified: Secondary | ICD-10-CM | POA: Diagnosis not present

## 2022-07-01 DIAGNOSIS — Z419 Encounter for procedure for purposes other than remedying health state, unspecified: Secondary | ICD-10-CM | POA: Diagnosis not present

## 2022-08-01 DIAGNOSIS — Z419 Encounter for procedure for purposes other than remedying health state, unspecified: Secondary | ICD-10-CM | POA: Diagnosis not present

## 2022-08-24 IMAGING — US US SOFT TISSUE HEAD/NECK
2 series · 12 of 12 positions shown · non-contrast
Comparison: None.

CLINICAL DATA: Large lump in the back of neck with pain for 20
years, getting larger

EXAM:
ULTRASOUND OF HEAD/NECK SOFT TISSUES
TECHNIQUE: Ultrasound examination of the head and neck soft tissues was
performed in the area of clinical concern.

[Series 1: us soft tissue head & neck (non-thyroid) · 11 acquisitions, 11 frames shown]
[im 1/11]
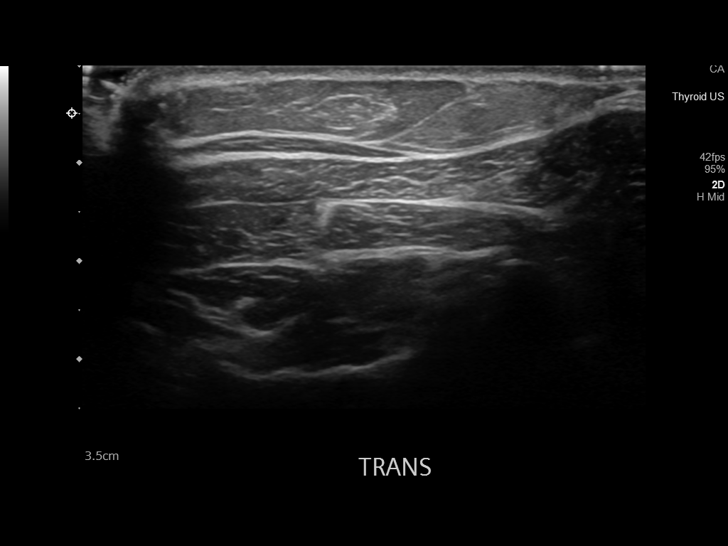
[im 2/11]
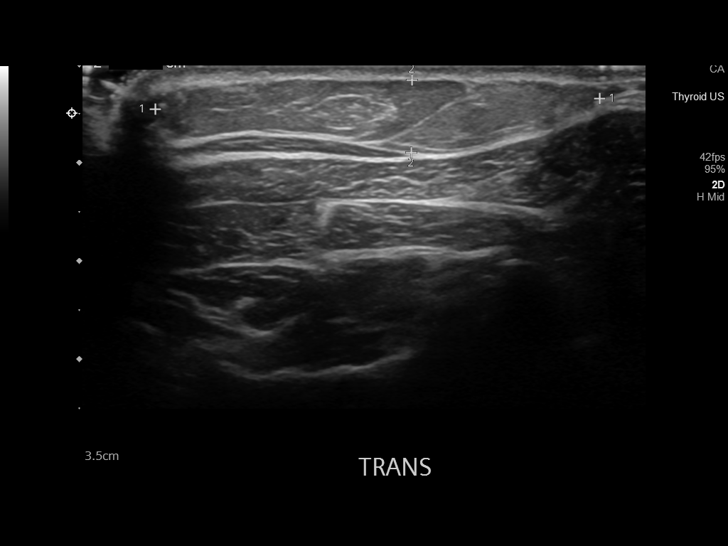
[im 3/11]
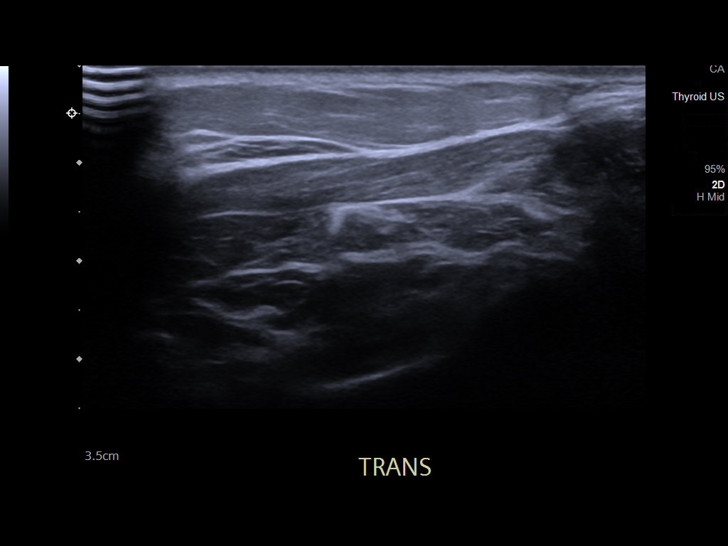
[im 4/11]
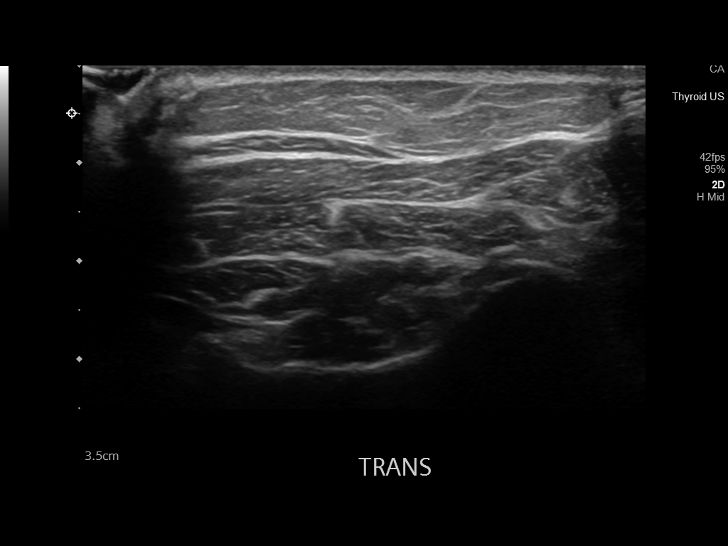
[im 5/11]
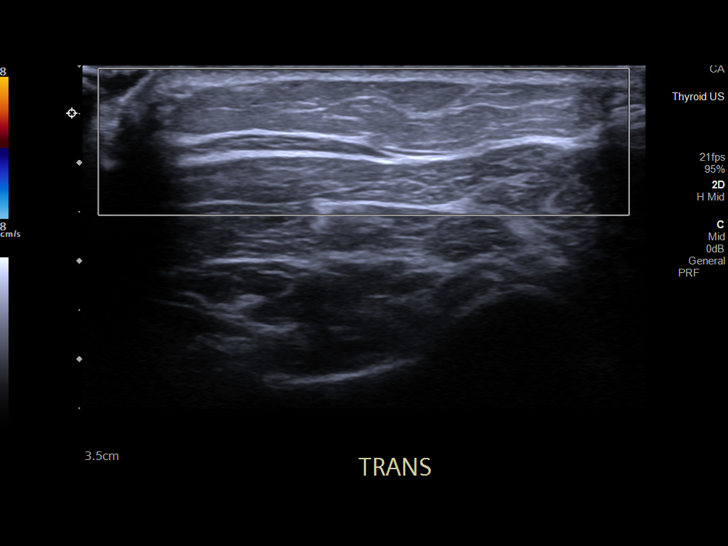
[im 6/11]
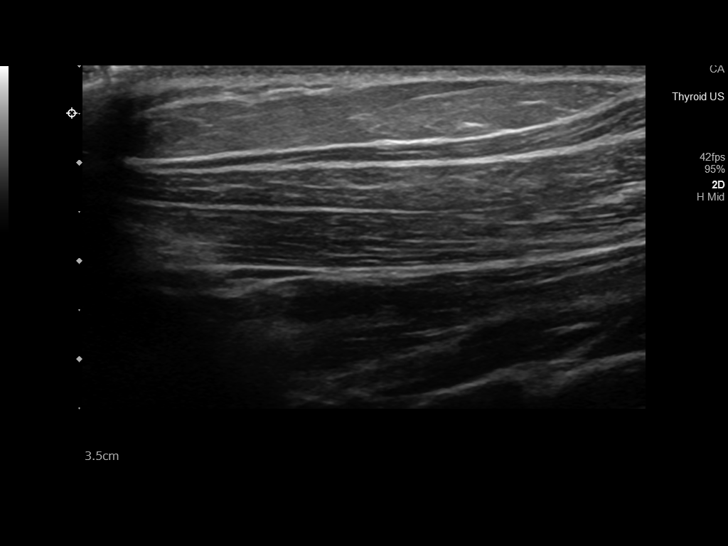
[im 7/11]
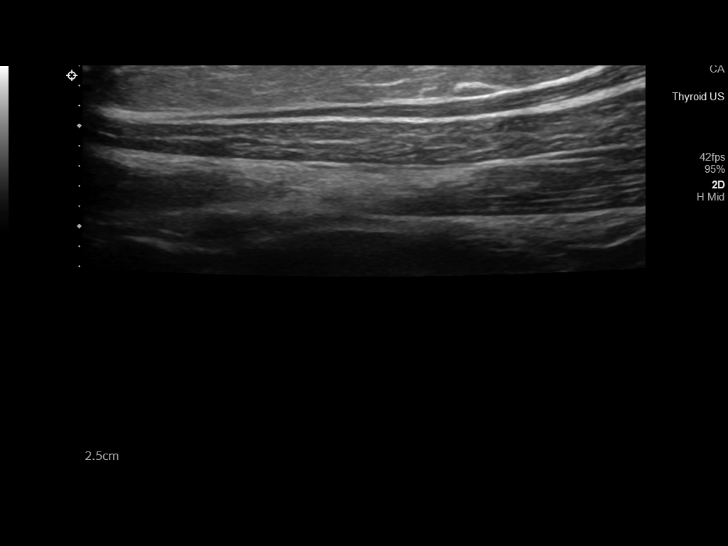
[im 8/11]
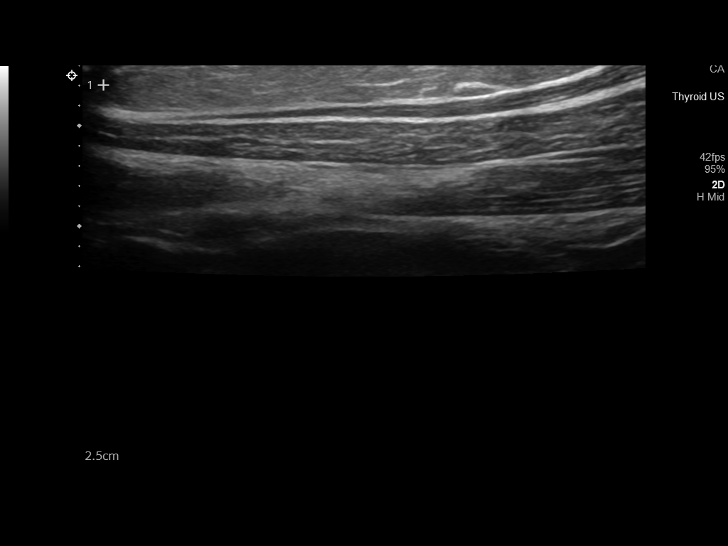
[im 9/11]
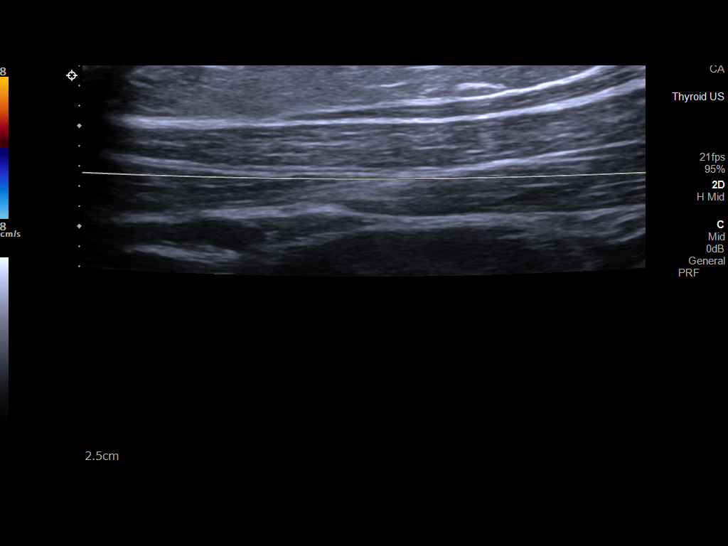
[im 10/11]
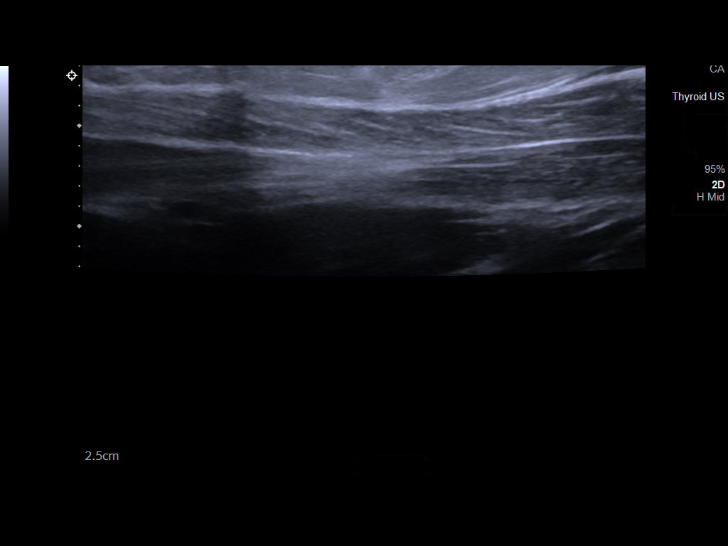
[im 11/11]
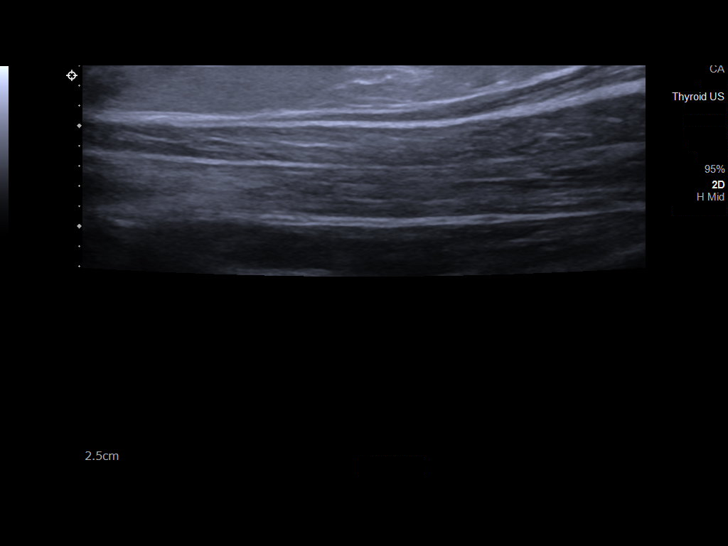

[Series 1001: thyroid us · 1 of 1 slices shown]
[im 1/1]
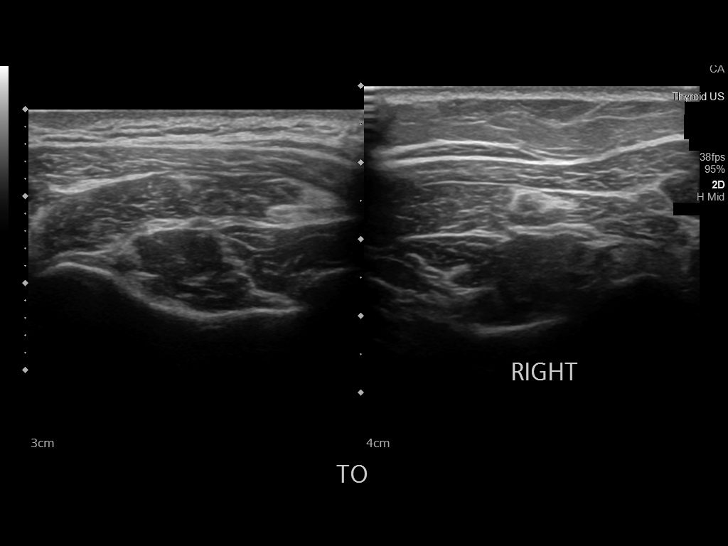

[12 of 12 positions shown; findings below may reference images not displayed]

FINDINGS: In the area of clinical concern, there is an isoechoic distinctly
marginated mass measuring 5.2 cm x 0.7 cm x 4.5 cm. There is no
evidence of violation of the surrounding tissue planes. There is no
flow within the lesion.
IMPRESSION: Isoechoic lesion in the area of clinical concern as described above
is favored to reflect a lipoma; however, given size and the history
of growth and pain, recommend MRI with and without contrast for
further evaluation. This may be obtained on a nonemergent basis.

## 2022-08-31 DIAGNOSIS — Z419 Encounter for procedure for purposes other than remedying health state, unspecified: Secondary | ICD-10-CM | POA: Diagnosis not present

## 2022-09-10 ENCOUNTER — Telehealth: Payer: Self-pay

## 2022-09-10 NOTE — Telephone Encounter (Signed)
Chart review completed for patient. Patient is due for cervical cancer screen. Mychart message sent to patient to inquire about scheduling.  Michai Dieppa, Population Health Specialist.   

## 2022-10-01 DIAGNOSIS — Z419 Encounter for procedure for purposes other than remedying health state, unspecified: Secondary | ICD-10-CM | POA: Diagnosis not present

## 2022-11-01 DIAGNOSIS — Z419 Encounter for procedure for purposes other than remedying health state, unspecified: Secondary | ICD-10-CM | POA: Diagnosis not present

## 2022-12-01 DIAGNOSIS — Z419 Encounter for procedure for purposes other than remedying health state, unspecified: Secondary | ICD-10-CM | POA: Diagnosis not present

## 2023-01-01 DIAGNOSIS — Z419 Encounter for procedure for purposes other than remedying health state, unspecified: Secondary | ICD-10-CM | POA: Diagnosis not present

## 2023-01-31 DIAGNOSIS — Z419 Encounter for procedure for purposes other than remedying health state, unspecified: Secondary | ICD-10-CM | POA: Diagnosis not present

## 2023-03-03 DIAGNOSIS — Z419 Encounter for procedure for purposes other than remedying health state, unspecified: Secondary | ICD-10-CM | POA: Diagnosis not present

## 2023-03-25 DIAGNOSIS — Z111 Encounter for screening for respiratory tuberculosis: Secondary | ICD-10-CM | POA: Diagnosis not present

## 2023-04-01 DIAGNOSIS — Z111 Encounter for screening for respiratory tuberculosis: Secondary | ICD-10-CM | POA: Diagnosis not present

## 2023-04-03 DIAGNOSIS — Z419 Encounter for procedure for purposes other than remedying health state, unspecified: Secondary | ICD-10-CM | POA: Diagnosis not present

## 2023-05-01 DIAGNOSIS — Z419 Encounter for procedure for purposes other than remedying health state, unspecified: Secondary | ICD-10-CM | POA: Diagnosis not present

## 2023-06-12 DIAGNOSIS — Z419 Encounter for procedure for purposes other than remedying health state, unspecified: Secondary | ICD-10-CM | POA: Diagnosis not present

## 2023-07-12 DIAGNOSIS — Z419 Encounter for procedure for purposes other than remedying health state, unspecified: Secondary | ICD-10-CM | POA: Diagnosis not present

## 2023-08-12 DIAGNOSIS — Z419 Encounter for procedure for purposes other than remedying health state, unspecified: Secondary | ICD-10-CM | POA: Diagnosis not present

## 2023-09-11 DIAGNOSIS — Z419 Encounter for procedure for purposes other than remedying health state, unspecified: Secondary | ICD-10-CM | POA: Diagnosis not present

## 2023-10-12 DIAGNOSIS — Z419 Encounter for procedure for purposes other than remedying health state, unspecified: Secondary | ICD-10-CM | POA: Diagnosis not present

## 2023-11-10 DIAGNOSIS — M25512 Pain in left shoulder: Secondary | ICD-10-CM | POA: Diagnosis not present

## 2023-11-10 DIAGNOSIS — N950 Postmenopausal bleeding: Secondary | ICD-10-CM | POA: Diagnosis not present

## 2023-11-10 DIAGNOSIS — M25511 Pain in right shoulder: Secondary | ICD-10-CM | POA: Diagnosis not present

## 2023-11-10 DIAGNOSIS — Z131 Encounter for screening for diabetes mellitus: Secondary | ICD-10-CM | POA: Diagnosis not present

## 2023-11-10 DIAGNOSIS — F331 Major depressive disorder, recurrent, moderate: Secondary | ICD-10-CM | POA: Diagnosis not present

## 2023-11-10 DIAGNOSIS — Z0131 Encounter for examination of blood pressure with abnormal findings: Secondary | ICD-10-CM | POA: Diagnosis not present

## 2023-11-10 DIAGNOSIS — Z0000 Encounter for general adult medical examination without abnormal findings: Secondary | ICD-10-CM | POA: Diagnosis not present

## 2023-11-10 DIAGNOSIS — Z1331 Encounter for screening for depression: Secondary | ICD-10-CM | POA: Diagnosis not present

## 2023-11-10 DIAGNOSIS — I10 Essential (primary) hypertension: Secondary | ICD-10-CM | POA: Diagnosis not present

## 2023-11-10 DIAGNOSIS — Z1389 Encounter for screening for other disorder: Secondary | ICD-10-CM | POA: Diagnosis not present

## 2023-11-12 DIAGNOSIS — Z419 Encounter for procedure for purposes other than remedying health state, unspecified: Secondary | ICD-10-CM | POA: Diagnosis not present

## 2023-12-12 DIAGNOSIS — Z419 Encounter for procedure for purposes other than remedying health state, unspecified: Secondary | ICD-10-CM | POA: Diagnosis not present
